# Patient Record
Sex: Male | Born: 1955
Health system: Southern US, Community
[De-identification: ages and names within clinical notes are randomized; demographics above are authoritative.]

## PROBLEM LIST (undated history)

## (undated) DIAGNOSIS — N400 Enlarged prostate without lower urinary tract symptoms: Secondary | ICD-10-CM

## (undated) HISTORY — PX: KNEE ARTHROSCOPY W/ MENISCECTOMY: SHX1879

## (undated) HISTORY — PX: CHOLECYSTECTOMY: SHX55

---

## 2019-11-09 DIAGNOSIS — Z0001 Encounter for general adult medical examination with abnormal findings: Secondary | ICD-10-CM | POA: Diagnosis not present

## 2019-11-09 DIAGNOSIS — Z125 Encounter for screening for malignant neoplasm of prostate: Secondary | ICD-10-CM | POA: Diagnosis not present

## 2019-11-24 DIAGNOSIS — R7301 Impaired fasting glucose: Secondary | ICD-10-CM | POA: Diagnosis not present

## 2019-11-24 DIAGNOSIS — E785 Hyperlipidemia, unspecified: Secondary | ICD-10-CM | POA: Diagnosis not present

## 2019-11-24 DIAGNOSIS — N401 Enlarged prostate with lower urinary tract symptoms: Secondary | ICD-10-CM | POA: Diagnosis not present

## 2020-05-31 DIAGNOSIS — E785 Hyperlipidemia, unspecified: Secondary | ICD-10-CM | POA: Diagnosis not present

## 2020-05-31 DIAGNOSIS — Z79899 Other long term (current) drug therapy: Secondary | ICD-10-CM | POA: Diagnosis not present

## 2020-05-31 DIAGNOSIS — R7301 Impaired fasting glucose: Secondary | ICD-10-CM | POA: Diagnosis not present

## 2020-06-04 DIAGNOSIS — E785 Hyperlipidemia, unspecified: Secondary | ICD-10-CM | POA: Diagnosis not present

## 2020-06-04 DIAGNOSIS — R7301 Impaired fasting glucose: Secondary | ICD-10-CM | POA: Diagnosis not present

## 2020-12-19 DIAGNOSIS — D487 Neoplasm of uncertain behavior of other specified sites: Secondary | ICD-10-CM | POA: Diagnosis not present

## 2020-12-19 DIAGNOSIS — R590 Localized enlarged lymph nodes: Secondary | ICD-10-CM | POA: Diagnosis not present

## 2020-12-21 ENCOUNTER — Other Ambulatory Visit (HOSPITAL_COMMUNITY): Payer: Self-pay | Admitting: Otolaryngology

## 2020-12-21 DIAGNOSIS — R221 Localized swelling, mass and lump, neck: Secondary | ICD-10-CM

## 2020-12-28 ENCOUNTER — Ambulatory Visit (HOSPITAL_BASED_OUTPATIENT_CLINIC_OR_DEPARTMENT_OTHER): Payer: BC Managed Care – PPO

## 2020-12-28 ENCOUNTER — Encounter (HOSPITAL_BASED_OUTPATIENT_CLINIC_OR_DEPARTMENT_OTHER): Payer: Self-pay

## 2021-01-07 ENCOUNTER — Ambulatory Visit
Admission: RE | Admit: 2021-01-07 | Discharge: 2021-01-07 | Disposition: A | Discharge status: Home / Self Care | Payer: BC Managed Care – PPO | Source: Ambulatory Visit | Attending: Otolaryngology | Admitting: Otolaryngology

## 2021-01-07 DIAGNOSIS — J387 Other diseases of larynx: Secondary | ICD-10-CM | POA: Diagnosis not present

## 2021-01-07 DIAGNOSIS — J341 Cyst and mucocele of nose and nasal sinus: Secondary | ICD-10-CM | POA: Diagnosis not present

## 2021-01-07 DIAGNOSIS — J0100 Acute maxillary sinusitis, unspecified: Secondary | ICD-10-CM | POA: Diagnosis not present

## 2021-01-07 DIAGNOSIS — R221 Localized swelling, mass and lump, neck: Secondary | ICD-10-CM

## 2021-01-07 DIAGNOSIS — J320 Chronic maxillary sinusitis: Secondary | ICD-10-CM | POA: Diagnosis not present

## 2021-01-07 MED ORDER — IOPAMIDOL (ISOVUE-300) INJECTION 61%
75.0000 mL | Freq: Once | INTRAVENOUS | Status: AC | PRN
  Administered 2021-01-07: 75 mL via INTRAVENOUS

## 2021-01-08 ENCOUNTER — Other Ambulatory Visit: Payer: Self-pay | Admitting: Otolaryngology

## 2021-01-08 DIAGNOSIS — R590 Localized enlarged lymph nodes: Secondary | ICD-10-CM | POA: Diagnosis not present

## 2021-01-08 DIAGNOSIS — D3705 Neoplasm of uncertain behavior of pharynx: Secondary | ICD-10-CM | POA: Diagnosis not present

## 2021-01-14 ENCOUNTER — Other Ambulatory Visit: Payer: Self-pay

## 2021-01-14 ENCOUNTER — Encounter (HOSPITAL_BASED_OUTPATIENT_CLINIC_OR_DEPARTMENT_OTHER): Payer: Self-pay | Admitting: Otolaryngology

## 2021-01-15 ENCOUNTER — Other Ambulatory Visit (HOSPITAL_COMMUNITY): Payer: BC Managed Care – PPO

## 2021-01-18 ENCOUNTER — Other Ambulatory Visit: Payer: Self-pay

## 2021-01-18 ENCOUNTER — Ambulatory Visit (HOSPITAL_BASED_OUTPATIENT_CLINIC_OR_DEPARTMENT_OTHER): Payer: BC Managed Care – PPO | Admitting: Anesthesiology

## 2021-01-18 ENCOUNTER — Ambulatory Visit (HOSPITAL_BASED_OUTPATIENT_CLINIC_OR_DEPARTMENT_OTHER)
Admission: RE | Admit: 2021-01-18 | Discharge: 2021-01-18 | Disposition: A | Discharge status: Home / Self Care | Payer: BC Managed Care – PPO | Attending: Otolaryngology | Admitting: Otolaryngology

## 2021-01-18 ENCOUNTER — Encounter (HOSPITAL_BASED_OUTPATIENT_CLINIC_OR_DEPARTMENT_OTHER)
Admission: RE | Disposition: A | Discharge status: Home / Self Care | Payer: Self-pay | Source: Home / Self Care | Attending: Otolaryngology

## 2021-01-18 ENCOUNTER — Encounter (HOSPITAL_BASED_OUTPATIENT_CLINIC_OR_DEPARTMENT_OTHER): Payer: Self-pay | Admitting: Otolaryngology

## 2021-01-18 DIAGNOSIS — C490 Malignant neoplasm of connective and soft tissue of head, face and neck: Secondary | ICD-10-CM | POA: Insufficient documentation

## 2021-01-18 DIAGNOSIS — R590 Localized enlarged lymph nodes: Secondary | ICD-10-CM | POA: Diagnosis not present

## 2021-01-18 DIAGNOSIS — C760 Malignant neoplasm of head, face and neck: Secondary | ICD-10-CM | POA: Diagnosis not present

## 2021-01-18 DIAGNOSIS — D3705 Neoplasm of uncertain behavior of pharynx: Secondary | ICD-10-CM | POA: Diagnosis not present

## 2021-01-18 DIAGNOSIS — N400 Enlarged prostate without lower urinary tract symptoms: Secondary | ICD-10-CM | POA: Diagnosis not present

## 2021-01-18 DIAGNOSIS — R221 Localized swelling, mass and lump, neck: Secondary | ICD-10-CM | POA: Diagnosis not present

## 2021-01-18 DIAGNOSIS — J358 Other chronic diseases of tonsils and adenoids: Secondary | ICD-10-CM | POA: Diagnosis not present

## 2021-01-18 HISTORY — PX: TONSILLECTOMY: SHX5217

## 2021-01-18 HISTORY — DX: Benign prostatic hyperplasia without lower urinary tract symptoms: N40.0

## 2021-01-18 HISTORY — PX: DIRECT LARYNGOSCOPY: SHX5326

## 2021-01-18 HISTORY — PX: MASS BIOPSY: SHX5445

## 2021-01-18 SURGERY — TONSILLECTOMY
Anesthesia: General | Site: Throat

## 2021-01-18 MED ORDER — FENTANYL CITRATE (PF) 100 MCG/2ML IJ SOLN: 25.0000 ug | INTRAMUSCULAR | Status: DC | PRN

## 2021-01-18 MED ORDER — DEXAMETHASONE SODIUM PHOSPHATE 4 MG/ML IJ SOLN
INTRAMUSCULAR | Status: DC | PRN
  Administered 2021-01-18: 10 mg via INTRAVENOUS

## 2021-01-18 MED ORDER — LIDOCAINE-EPINEPHRINE 1 %-1:100000 IJ SOLN
INTRAMUSCULAR | Status: AC
  Filled 2021-01-18: qty 2

## 2021-01-18 MED ORDER — MIDAZOLAM HCL 2 MG/2ML IJ SOLN
INTRAMUSCULAR | Status: AC
  Filled 2021-01-18: qty 2

## 2021-01-18 MED ORDER — ONDANSETRON HCL 4 MG/2ML IJ SOLN
INTRAMUSCULAR | Status: DC | PRN
  Administered 2021-01-18: 4 mg via INTRAVENOUS

## 2021-01-18 MED ORDER — FENTANYL CITRATE (PF) 100 MCG/2ML IJ SOLN
INTRAMUSCULAR | Status: AC
  Filled 2021-01-18: qty 2

## 2021-01-18 MED ORDER — LIDOCAINE 2% (20 MG/ML) 5 ML SYRINGE
INTRAMUSCULAR | Status: AC
  Filled 2021-01-18: qty 5

## 2021-01-18 MED ORDER — ONDANSETRON HCL 4 MG/2ML IJ SOLN
INTRAMUSCULAR | Status: AC
  Filled 2021-01-18: qty 2

## 2021-01-18 MED ORDER — BACITRACIN ZINC 500 UNIT/GM EX OINT
TOPICAL_OINTMENT | CUTANEOUS | Status: AC
  Filled 2021-01-18: qty 0.9

## 2021-01-18 MED ORDER — FENTANYL CITRATE (PF) 100 MCG/2ML IJ SOLN
INTRAMUSCULAR | Status: DC | PRN
  Administered 2021-01-18: 50 ug via INTRAVENOUS
  Administered 2021-01-18 (×4): 25 ug via INTRAVENOUS
  Administered 2021-01-18 (×2): 50 ug via INTRAVENOUS

## 2021-01-18 MED ORDER — PHENYLEPHRINE HCL (PRESSORS) 10 MG/ML IV SOLN: INTRAVENOUS | Status: DC | PRN

## 2021-01-18 MED ORDER — EPHEDRINE SULFATE 50 MG/ML IJ SOLN
INTRAMUSCULAR | Status: DC | PRN
  Administered 2021-01-18 (×3): 10 mg via INTRAVENOUS

## 2021-01-18 MED ORDER — OXYCODONE-ACETAMINOPHEN 5-325 MG PO TABS: 1.0000 | ORAL_TABLET | ORAL | 0 refills | Status: AC | PRN

## 2021-01-18 MED ORDER — LACTATED RINGERS IV SOLN: INTRAVENOUS | Status: DC

## 2021-01-18 MED ORDER — ROCURONIUM BROMIDE 10 MG/ML (PF) SYRINGE
PREFILLED_SYRINGE | INTRAVENOUS | Status: AC
  Filled 2021-01-18: qty 10

## 2021-01-18 MED ORDER — AMOXICILLIN 400 MG/5ML PO SUSR: 800.0000 mg | Freq: Two times a day (BID) | ORAL | 0 refills | Status: AC

## 2021-01-18 MED ORDER — LIDOCAINE 2% (20 MG/ML) 5 ML SYRINGE
INTRAMUSCULAR | Status: DC | PRN
  Administered 2021-01-18: 80 mg via INTRAVENOUS

## 2021-01-18 MED ORDER — LIDOCAINE-EPINEPHRINE 1 %-1:100000 IJ SOLN
INTRAMUSCULAR | Status: DC | PRN
  Administered 2021-01-18: 3 mL

## 2021-01-18 MED ORDER — PROPOFOL 500 MG/50ML IV EMUL
INTRAVENOUS | Status: AC
  Filled 2021-01-18: qty 50

## 2021-01-18 MED ORDER — DEXAMETHASONE SODIUM PHOSPHATE 10 MG/ML IJ SOLN
INTRAMUSCULAR | Status: AC
  Filled 2021-01-18: qty 1

## 2021-01-18 MED ORDER — PROPOFOL 10 MG/ML IV BOLUS
INTRAVENOUS | Status: DC | PRN
  Administered 2021-01-18: 150 mg via INTRAVENOUS
  Administered 2021-01-18: 50 mg via INTRAVENOUS

## 2021-01-18 MED ORDER — CEFAZOLIN SODIUM-DEXTROSE 2-3 GM-%(50ML) IV SOLR
INTRAVENOUS | Status: DC | PRN
  Administered 2021-01-18: 2 g via INTRAVENOUS

## 2021-01-18 MED ORDER — PHENYLEPHRINE HCL-NACL 10-0.9 MG/250ML-% IV SOLN
INTRAVENOUS | Status: DC | PRN
  Administered 2021-01-18: 50 ug/min via INTRAVENOUS

## 2021-01-18 MED ORDER — PROPOFOL 500 MG/50ML IV EMUL
INTRAVENOUS | Status: DC | PRN
  Administered 2021-01-18: 50 ug/kg/min via INTRAVENOUS

## 2021-01-18 MED ORDER — OXYMETAZOLINE HCL 0.05 % NA SOLN
NASAL | Status: AC
  Filled 2021-01-18: qty 60

## 2021-01-18 MED ORDER — SUCCINYLCHOLINE CHLORIDE 20 MG/ML IJ SOLN
INTRAMUSCULAR | Status: DC | PRN
  Administered 2021-01-18: 140 mg via INTRAVENOUS

## 2021-01-18 MED ORDER — MIDAZOLAM HCL 5 MG/5ML IJ SOLN
INTRAMUSCULAR | Status: DC | PRN
  Administered 2021-01-18: 2 mg via INTRAVENOUS

## 2021-01-18 MED ORDER — PHENYLEPHRINE HCL (PRESSORS) 10 MG/ML IV SOLN
INTRAVENOUS | Status: DC | PRN
  Administered 2021-01-18 (×2): 80 ug via INTRAVENOUS

## 2021-01-18 SURGICAL SUPPLY — 83 items
ADH SKN CLS APL DERMABOND .7 (GAUZE/BANDAGES/DRESSINGS) ×3
APL SKNCLS STERI-STRIP NONHPOA (GAUZE/BANDAGES/DRESSINGS)
BENZOIN TINCTURE PRP APPL 2/3 (GAUZE/BANDAGES/DRESSINGS) IMPLANT
BLADE SURG 15 STRL LF DISP TIS (BLADE) ×3 IMPLANT
BLADE SURG 15 STRL SS (BLADE) ×4
BNDG COHESIVE 2X5 TAN STRL LF (GAUZE/BANDAGES/DRESSINGS) IMPLANT
CANISTER SUCT 1200ML W/VALVE (MISCELLANEOUS) ×4 IMPLANT
CATH ROBINSON RED A/P 10FR (CATHETERS) IMPLANT
CATH ROBINSON RED A/P 14FR (CATHETERS) ×4 IMPLANT
CLEANER CAUTERY TIP 5X5 PAD (MISCELLANEOUS) IMPLANT
CLIP VESOCCLUDE MED 6/CT (CLIP) IMPLANT
CLIP VESOCCLUDE SM WIDE 6/CT (CLIP) IMPLANT
COAGULATOR SUCT SWTCH 10FR 6 (ELECTROSURGICAL) ×4 IMPLANT
CORD BIPOLAR FORCEPS 12FT (ELECTRODE) ×4 IMPLANT
COVER BACK TABLE 60X90IN (DRAPES) ×8 IMPLANT
COVER MAYO STAND STRL (DRAPES) ×8 IMPLANT
COVER WAND RF STERILE (DRAPES) IMPLANT
DECANTER SPIKE VIAL GLASS SM (MISCELLANEOUS) ×4 IMPLANT
DERMABOND ADVANCED (GAUZE/BANDAGES/DRESSINGS) ×1
DERMABOND ADVANCED .7 DNX12 (GAUZE/BANDAGES/DRESSINGS) ×3 IMPLANT
DRAIN JACKSON RD 7FR 3/32 (WOUND CARE) IMPLANT
DRAIN PENROSE 12X.25 LTX STRL (MISCELLANEOUS) IMPLANT
DRAPE U-SHAPE 76X120 STRL (DRAPES) ×8 IMPLANT
ELECT COATED BLADE 2.86 ST (ELECTRODE) ×4 IMPLANT
ELECT NEEDLE BLADE 2-5/6 (NEEDLE) IMPLANT
ELECT PAIRED SUBDERMAL (MISCELLANEOUS) ×4
ELECT REM PT RETURN 9FT ADLT (ELECTROSURGICAL) ×4
ELECT REM PT RETURN 9FT PED (ELECTROSURGICAL)
ELECTRODE PAIRED SUBDERMAL (MISCELLANEOUS) ×3 IMPLANT
ELECTRODE REM PT RETRN 9FT PED (ELECTROSURGICAL) IMPLANT
ELECTRODE REM PT RTRN 9FT ADLT (ELECTROSURGICAL) ×3 IMPLANT
EVACUATOR SILICONE 100CC (DRAIN) IMPLANT
FORCEPS BIPOLAR SPETZLER 8 1.0 (NEUROSURGERY SUPPLIES) ×4 IMPLANT
GAUZE 4X4 16PLY RFD (DISPOSABLE) ×4 IMPLANT
GAUZE SPONGE 4X4 12PLY STRL LF (GAUZE/BANDAGES/DRESSINGS) ×4 IMPLANT
GLOVE SURG ENC MOIS LTX SZ7.5 (GLOVE) ×8 IMPLANT
GOWN STRL REUS W/ TWL LRG LVL3 (GOWN DISPOSABLE) ×12 IMPLANT
GOWN STRL REUS W/TWL LRG LVL3 (GOWN DISPOSABLE) ×16
HEMOSTAT SNOW SURGICEL 2X4 (HEMOSTASIS) ×4 IMPLANT
HEMOSTAT SURGICEL .5X2 ABSORB (HEMOSTASIS) IMPLANT
IV NS 500ML (IV SOLUTION) ×4
IV NS 500ML BAXH (IV SOLUTION) ×3 IMPLANT
LOCATOR NERVE 3 VOLT (DISPOSABLE) IMPLANT
MARKER SKIN DUAL TIP RULER LAB (MISCELLANEOUS) ×4 IMPLANT
NEEDLE HYPO 25X1 1.5 SAFETY (NEEDLE) ×4 IMPLANT
NEEDLE PRECISIONGLIDE 27X1.5 (NEEDLE) IMPLANT
NS IRRIG 1000ML POUR BTL (IV SOLUTION) ×4 IMPLANT
PACK BASIN DAY SURGERY FS (CUSTOM PROCEDURE TRAY) ×4 IMPLANT
PAD CLEANER CAUTERY TIP 5X5 (MISCELLANEOUS)
PENCIL SMOKE EVACUATOR (MISCELLANEOUS) ×4 IMPLANT
PIN SAFETY STERILE (MISCELLANEOUS) IMPLANT
PROBE NERVBE PRASS .33 (MISCELLANEOUS) ×4 IMPLANT
SHEARS HARMONIC 9CM CVD (BLADE) ×4 IMPLANT
SHEET MEDIUM DRAPE 40X70 STRL (DRAPES) ×4 IMPLANT
SLEEVE SCD COMPRESS KNEE MED (STOCKING) ×4 IMPLANT
SOLUTION BUTLER CLEAR DIP (MISCELLANEOUS) ×4 IMPLANT
SPONGE GAUZE 2X2 8PLY STRL LF (GAUZE/BANDAGES/DRESSINGS) IMPLANT
SPONGE INTESTINAL PEANUT (DISPOSABLE) ×4 IMPLANT
SPONGE TONSIL TAPE 1.25 RFD (DISPOSABLE) IMPLANT
STAPLER VISISTAT 35W (STAPLE) ×4 IMPLANT
STRIP CLOSURE SKIN 1/4X4 (GAUZE/BANDAGES/DRESSINGS) IMPLANT
SUCTION FRAZIER HANDLE 10FR (MISCELLANEOUS)
SUCTION TUBE FRAZIER 10FR DISP (MISCELLANEOUS) IMPLANT
SUT ETHILON 3 0 PS 1 (SUTURE) IMPLANT
SUT ETHILON 4 0 PS 2 18 (SUTURE) IMPLANT
SUT PROLENE 5 0 P 3 (SUTURE) IMPLANT
SUT SILK 3 0 TIES 17X18 (SUTURE) ×4
SUT SILK 3-0 18XBRD TIE BLK (SUTURE) ×3 IMPLANT
SUT SILK 4 0 TIES 17X18 (SUTURE) IMPLANT
SUT VIC AB 3-0 FS2 27 (SUTURE) IMPLANT
SUT VIC AB 4-0 P-3 18XBRD (SUTURE) IMPLANT
SUT VIC AB 4-0 P3 18 (SUTURE)
SUT VIC AB 4-0 RB1 27 (SUTURE)
SUT VIC AB 4-0 RB1 27X BRD (SUTURE) IMPLANT
SUT VICRYL 4-0 PS2 18IN ABS (SUTURE) ×4 IMPLANT
SYR BULB EAR ULCER 3OZ GRN STR (SYRINGE) ×8 IMPLANT
SYR CONTROL 10ML LL (SYRINGE) ×4 IMPLANT
TOWEL GREEN STERILE FF (TOWEL DISPOSABLE) ×8 IMPLANT
TRAY DSU PREP LF (CUSTOM PROCEDURE TRAY) ×4 IMPLANT
TUBE CONNECTING 20X1/4 (TUBING) ×8 IMPLANT
TUBE SALEM SUMP 12R W/ARV (TUBING) IMPLANT
TUBE SALEM SUMP 16 FR W/ARV (TUBING) ×4 IMPLANT
WAND COBLATOR 70 EVAC XTRA (SURGICAL WAND) ×4 IMPLANT

## 2021-01-18 NOTE — Op Note (Signed)
DATE OF PROCEDURE:  01/18/2021                              OPERATIVE REPORT  SURGEON:  Leta Baptist, MD  PREOPERATIVE DIAGNOSES: 1. Left neck mass 2. Possible left tonsillar mass  POSTOPERATIVE DIAGNOSES: 1. Left neck mass 2. Possible left tonsillar mass  PROCEDURE PERFORMED:   1. Left level 2 deep neck mass excision 2. Left tonsillectomy 3. Direct laryngoscopy  ANESTHESIA:  General endotracheal tube anesthesia.  COMPLICATIONS:  None.  ESTIMATED BLOOD LOSS:  Minimal.  INDICATION FOR PROCEDURE:  Taylor Turner. Disney is a 65 y.o. male with a history of an enlarging 3 cm left neck mass.  The patient subsequently underwent a neck CT scan.  The CT showed a 3.2 cm heterogeneously enhancing mass within the left neck at Level II station.  The radiographic findings were suggestive of metastatic disease.  No other cervical lymphadenopathy was noted.  No definitive primary mass was noted within the oral cavity, pharynx, or larynx. However, a firm mass was palpable within the left tonsillar fossa.  Based on the above findings, the decision was made for the patient to undergo the left tonsillectomy procedure.  Intraoperatively, no obvious mass was noted within the left tonsil.  The previously palpable firm mass was secondary to a calcified stylohyoid ligament.  The decision was therefore to proceed with direct laryngoscopy and excision of the left neck mass.  It should be noted that the patient previously stated his preference for left neck mass excision rather than fine-needle aspiration or core needle biopsy.    DESCRIPTION:  The patient was taken to the operating room and placed supine on the operating table.  General endotracheal tube anesthesia was administered by the anesthesiologist.  The patient was positioned and prepped and draped in a standard fashion for tonsillectomy.  A Crowe-Davis mouth gag was inserted into the oral cavity for exposure. 1+ tonsils were noted bilaterally.  No bifidity was noted.   Indirect mirror examination of the nasopharynx revealed no visible lesion.  The left tonsil was then grasped with a straight Allis clamp and retracted medially.  It was resected free from the underlying pharyngeal constrictor muscles with the Coblator device.  No palpable mass was noted within the tonsil.  The tonsil was sent to the pathology department for frozen section analysis.  The previously noted firm mass within the tonsillar fossa was found to be the calcified stylohyoid ligament.   The decision was then made to perform the direct laryngoscopy procedure.  The mouthgag was removed.  Dedo laryngoscope was used for examination.  The Dedo laryngoscope was inserted via the oral cavity into the pharynx.  Examination of the vallecula, epiglottis, aryepiglottic folds, piriform sinus, and the vocal cords were all normal.  The Dedo laryngoscope was withdrawn.  The patient was repositioned and prepped and draped in a standard fashion for left neck mass excision.  The facial nerve monitoring electrodes were placed.  1% lidocaine with 1-100,000 epinephrine was infiltrated at the planned site of incision.  The patient was noted to have a 3 cm left level 2 neck mass.  A transverse incision was made over the left neck mass.  The incision was carried down to the level of the platysma muscles.  Superiorly and inferiorly based subplatysmal flaps were elevated in the standard fashion.  The sternocleidomastoid muscles was then retracted laterally, exposing the 3 cm level-2 mass.  Careful dissection was  then carried out to free the soft tissue mass from the surrounding structures.  The spinal accessory nerve was identified and preserved.  The internal jugular vein was also identified and carefully dissected free from the mass.  The entire mass was removed and sent to the pathology department for identification.  The surgical site was copiously irrigated.  The incision was closed in layers with 4-0 Vicryl and  Dermabond.  The care of the patient was turned over to the anesthesiologist.  The patient was awakened from anesthesia without difficulty.  The patient was extubated and transferred to the recovery room in good condition.  OPERATIVE FINDINGS: Normal left tonsil.  No suspicious lesion was noted on today's direct laryngoscopy examination. A 3 cm left level 2 neck mass was noted.  SPECIMEN: Left neck mass.  FOLLOWUP CARE:  The patient will be discharged home once awake and alert. The patient will follow up in my office in approximately 1 week.  Chetan Mehring W Treavon Castilleja 01/18/2021 9:45 AM

## 2021-01-18 NOTE — Discharge Instructions (Signed)
Post Anesthesia Home Care Instructions  Activity: Get plenty of rest for the remainder of the day. A responsible individual must stay with you for 24 hours following the procedure.  For the next 24 hours, DO NOT: -Drive a car -Paediatric nurse -Drink alcoholic beverages -Take any medication unless instructed by your physician -Make any legal decisions or sign important papers.  Meals: Start with liquid foods such as gelatin or soup. Progress to regular foods as tolerated. Avoid greasy, spicy, heavy foods. If nausea and/or vomiting occur, drink only clear liquids until the nausea and/or vomiting subsides. Call your physician if vomiting continues.  Special Instructions/Symptoms: Your throat may feel dry or sore from the anesthesia or the breathing tube placed in your throat during surgery. If this causes discomfort, gargle with warm salt water. The discomfort should disappear within 24 hours.  If you had a scopolamine patch placed behind your ear for the management of post- operative nausea and/or vomiting:  1. The medication in the patch is effective for 72 hours, after which it should be removed.  Wrap patch in a tissue and discard in the trash. Wash hands thoroughly with soap and water. 2. You may remove the patch earlier than 72 hours if you experience unpleasant side effects which may include dry mouth, dizziness or visual disturbances. 3. Avoid touching the patch. Wash your hands with soap and water after contact with the patch.    -------------  Emery Dupuy [facility] M[facility] Postoperative Instructions for Tonsillectomy  Activity Restrict activity at home for the first two days, resting as much as possible. Light indoor activity is best. You may usually return to school or work within a week but void strenuous activity and sports for two weeks. Sleep with your head elevated on 2-3 pillows for 3-4 days to help decrease swelling. Diet Due to tissue swelling and throat discomfort, you  may have little desire to drink for several days. However fluids are very important to prevent dehydration. You will find that non-acidic juices, soups, popsicles, Jell-O, custard, puddings, and any soft or mashed foods taken in small quantities can be swallowed fairly easily. Try to increase your fluid and food intake as the discomfort subsides. It is recommended that a child receive 1-1/2 quarts of fluid in a 24-hour period. Adult require twice this amount.  Discomfort Your sore throat may be relieved by applying an ice collar to your neck and/or by taking Tylenol. You may experience an earache, which is due to referred pain from the throat. Referred ear pain is commonly felt at night when trying to rest.  Bleeding                        Although rare, there is risk of having some bleeding during the first 2 weeks after having a T&A. This usually happens between days 7-10 postoperatively. If you or your child should have any bleeding, try to remain calm. We recommend sitting up quietly in a chair and gently spitting out the blood into a bowl. For adults, gargling gently with ice water may help. If the bleeding does not stop after a short time (5 minutes), is more than 1 teaspoonful, or if you become worried, please call our office at [phone] or go directly to the nearest hospital emergency room. Do not eat or drink anything prior to going to the hospital as you may need to be taken to the operating room in order to control the bleeding. GENERAL  CONSIDERATIONS 1. Brush your teeth regularly. Avoid mouthwashes and gargles for three weeks. You may gargle gently with warm salt-water as necessary or spray with Chloraseptic. You may make salt-water by placing 2 teaspoons of table salt into a quart of fresh water. Warm the salt-water in a microwave to a luke warm temperature.  2. Avoid exposure to colds and upper respiratory infections if possible.  3. If you look into a mirror or into your child's mouth,  you will see white-gray patches in the back of the throat. This is normal after having a T&A and is like a scab that forms on the skin after an abrasion. It will disappear once the back of the throat heals completely. However, it may cause a noticeable odor; this too will disappear with time. Again, warm salt-water gargles may be used to help keep the throat clean and promote healing.  4. You may notice a temporary change in voice quality, such as a higher pitched voice or a nasal sound, until healing is complete. This may last for 1-2 weeks and should resolve.  5. Do not take or give you child any medications that we have not prescribed or recommended.  6. Snoring may occur, especially at night, for the first week after a T&A. It is due to swelling of the soft palate and will usually resolve.  Please call our office at [phone] if you have any questions.

## 2021-01-18 NOTE — Transfer of Care (Signed)
Immediate Anesthesia Transfer of Care Note  Patient: Taylor Turner  Procedure(s) Performed: TONSILLECTOMY WITH FROZEN SECTION (Left Throat) LEFT NECK MASS EXCISION (Left Neck) DIRECT LARYNGOSCOPY (N/A Throat)  Patient Location: PACU  Anesthesia Type:General  Level of Consciousness: sedated  Airway & Oxygen Therapy: Patient Spontanous Breathing and Patient connected to face mask oxygen  Post-op Assessment: Report given to RN and Post -op Vital signs reviewed and stable  Post vital signs: Reviewed and stable  Last Vitals:  Vitals Value Taken Time  BP 128/75 01/18/21 0948  Temp 36.5 C 01/18/21 0948  Pulse 85 01/18/21 0950  Resp 14 01/18/21 0950  SpO2 97 % 01/18/21 0950  Vitals shown include unvalidated device data.  Last Pain:  Vitals:   01/18/21 0648  TempSrc: Oral  PainSc: 0-No pain         Complications: No complications documented.

## 2021-01-18 NOTE — Anesthesia Postprocedure Evaluation (Signed)
Anesthesia Post Note  Patient: Taylor Turner. Junkins  Procedure(s) Performed: TONSILLECTOMY WITH FROZEN SECTION (Left Throat) LEFT NECK MASS EXCISION (Left Neck) DIRECT LARYNGOSCOPY (N/A Throat)     Patient location during evaluation: PACU Anesthesia Type: General Level of consciousness: awake Pain management: pain level controlled Vital Signs Assessment: post-procedure vital signs reviewed and stable Respiratory status: spontaneous breathing Cardiovascular status: stable Postop Assessment: no apparent nausea or vomiting Anesthetic complications: no   No complications documented.  Last Vitals:  Vitals:   01/18/21 1000 01/18/21 1025  BP: 121/81 126/72  Pulse: 94 92  Resp: 14 16  Temp:  36.6 C  SpO2: 96% 96%    Last Pain:  Vitals:   01/18/21 1025  TempSrc:   PainSc: 0-No pain                 Lerry Cordrey

## 2021-01-18 NOTE — Anesthesia Procedure Notes (Signed)
Procedure Name: Intubation Date/Time: 01/18/2021 7:35 AM Performed by: Maryella Shivers, CRNA Pre-anesthesia Checklist: Patient identified, Emergency Drugs available, Suction available and Patient being monitored Patient Re-evaluated:Patient Re-evaluated prior to induction Oxygen Delivery Method: Circle system utilized Preoxygenation: Pre-oxygenation with 100% oxygen Induction Type: IV induction Ventilation: Mask ventilation without difficulty Laryngoscope Size: Mac and 4 Grade View: Grade I Tube type: Oral Tube size: 7.5 mm Number of attempts: 1 Airway Equipment and Method: Stylet and Oral airway Placement Confirmation: ETT inserted through vocal cords under direct vision,  positive ETCO2 and breath sounds checked- equal and bilateral Secured at: 22 cm Tube secured with: Tape Dental Injury: Teeth and Oropharynx as per pre-operative assessment

## 2021-01-18 NOTE — Anesthesia Preprocedure Evaluation (Addendum)
Anesthesia Evaluation  Patient identified by MRN, date of birth, ID band Patient awake    Reviewed: Allergy & Precautions, NPO status , Patient's Chart, lab work & pertinent test results  Airway Mallampati: II  TM Distance: >3 FB     Dental   Pulmonary former smoker,    breath sounds clear to auscultation       Cardiovascular negative cardio ROS   Rhythm:Regular Rate:Normal     Neuro/Psych    GI/Hepatic negative GI ROS, Neg liver ROS,   Endo/Other  negative endocrine ROS  Renal/GU negative Renal ROS     Musculoskeletal   Abdominal   Peds  Hematology   Anesthesia Other Findings   Reproductive/Obstetrics                             Anesthesia Physical Anesthesia Plan  ASA: II  Anesthesia Plan: General   Post-op Pain Management:    Induction: Intravenous  PONV Risk Score and Plan: 3 and Ondansetron, Dexamethasone and Midazolam  Airway Management Planned: Oral ETT  Additional Equipment:   Intra-op Plan:   Post-operative Plan: Extubation in OR  Informed Consent: I have reviewed the patients History and Physical, chart, labs and discussed the procedure including the risks, benefits and alternatives for the proposed anesthesia with the patient or authorized representative who has indicated his/her understanding and acceptance.     Dental advisory given  Plan Discussed with: CRNA and Anesthesiologist  Anesthesia Plan Comments:        Anesthesia Quick Evaluation

## 2021-01-18 NOTE — H&P (Signed)
CC: Left neck mass, left tonsillar mass  HPI: The patient is a 65 year old male who returns today for his follow-up evaluation. The patient was last seen 3 weeks ago.  He was noted to have a 3 cm left neck mass.  No obvious mass or lesion was noted on his laryngoscopy examination.  The patient subsequently underwent a neck CT scan.  The CT showed a 3.2 cm heterogeneously enhancing mass within the left neck at Level II station.  The radiographic findings were suggestive of metastatic disease.  No other cervical lymphadenopathy was noted.  No definitive primary mass was noted within the oral cavity, pharynx, or larynx.  The patient returns reporting no significant change in his left neck mass.  He denies any significant dysphagia, odynophagia or dyspnea.   Exam: General: Communicates without difficulty, well nourished, no acute distress. Head: Normocephalic, no evidence injury, no tenderness, facial buttresses intact without stepoff. Face/sinus: No tenderness to palpation and percussion. Facial movement is normal and symmetric. Eyes: PERRL, EOMI. No scleral icterus, conjunctivae clear. Neuro: CN II exam reveals vision grossly intact.  No nystagmus at any point of gaze. Ears: Auricles well formed without lesions.  Ear canals are intact without mass or lesion.  No erythema or edema is appreciated.  The TMs are intact without fluid. Nose: External evaluation reveals normal support and skin without lesions.  Dorsum is intact.  Anterior rhinoscopy reveals pink mucosa over anterior aspect of inferior turbinates and intact septum.  No purulence noted. Oral:  Oral cavity and oropharynx are intact, symmetric, without erythema or edema.  Mucosa is moist without lesions. 1+ tonsils bilaterally.  No significant asymmetry is noted. However, a firm mass is palpable within the left tonsillar fossa. Neck: Full range of motion without pain. The patient is noted to have a 3.2 cm firm mass within the left Level II neck.  Thyroid  bed within normal limits to palpation.  Parotid glands and submandibular glands equal bilaterally without mass.  Trachea is midline. Neuro:  CN 2-12 grossly intact. Gait normal.   Assessment: 1.  The patient has a 3.2 cm enhancing soft tissue mass within the left Level II neck.   2.  No visible lesions are noted on today's ENT examination.  However, a firm mass is palpable within the left tonsillar fossa.  This most likely represents the primary lesion.    Plan: 1.  The physical exam findings and the CT results are reviewed with the patient.   2.  Based on the above findings, the patient will benefit from undergoing left tonsillectomy procedure with intraoperative frozen section analysis.  If the left tonsil is negative, we will proceed with left neck mass biopsy.  The risks, benefits, alternatives and details of the procedures are reviewed with the patient.  Questions are invited and answered.  3.  The patient would like to proceed with the procedures.

## 2021-01-21 ENCOUNTER — Encounter (HOSPITAL_BASED_OUTPATIENT_CLINIC_OR_DEPARTMENT_OTHER): Payer: Self-pay | Admitting: Otolaryngology

## 2021-01-25 ENCOUNTER — Other Ambulatory Visit (HOSPITAL_COMMUNITY): Payer: Self-pay | Admitting: Otolaryngology

## 2021-01-25 ENCOUNTER — Other Ambulatory Visit: Payer: Self-pay | Admitting: Otolaryngology

## 2021-01-25 DIAGNOSIS — C760 Malignant neoplasm of head, face and neck: Secondary | ICD-10-CM

## 2021-02-05 ENCOUNTER — Other Ambulatory Visit: Payer: Self-pay

## 2021-02-05 ENCOUNTER — Ambulatory Visit (HOSPITAL_COMMUNITY)
Admission: RE | Admit: 2021-02-05 | Discharge: 2021-02-05 | Disposition: A | Discharge status: Home / Self Care | Payer: BC Managed Care – PPO | Source: Ambulatory Visit | Attending: Otolaryngology | Admitting: Otolaryngology

## 2021-02-05 DIAGNOSIS — C760 Malignant neoplasm of head, face and neck: Secondary | ICD-10-CM | POA: Insufficient documentation

## 2021-02-05 LAB — GLUCOSE, CAPILLARY: Glucose-Capillary: 101 mg/dL — ABNORMAL HIGH (ref 70–99)

## 2021-02-05 MED ORDER — FLUDEOXYGLUCOSE F - 18 (FDG) INJECTION
10.7000 | Freq: Once | INTRAVENOUS | Status: AC | PRN
  Administered 2021-02-05: 10.7 via INTRAVENOUS

## 2021-02-07 ENCOUNTER — Ambulatory Visit (HOSPITAL_COMMUNITY): Payer: BC Managed Care – PPO | Admitting: Hematology

## 2021-02-13 NOTE — Progress Notes (Signed)
Taylor Turner, [place] [id number]   CLINIC:  Medical Oncology/Hematology  CONSULT NOTE  Patient Care Team: Asencion Noble, MD as PCP - General (Internal Medicine)  CHIEF COMPLAINTS/PURPOSE OF CONSULTATION:  Evaluation of squamous cell carcinoma of the neck  HISTORY OF PRESENTING ILLNESS:  Mr. Taylor Turner. [name] 65 y.o. male is here because of squamous cell carcinoma of the neck, at the request of Dr. Benjamine Mola.  Today he reports feeling good. He noticed swelling of a left lymph node in the neck 4 months ago. He never had any previous biopsies. He has not had any changes to his voice, and denies any current unusual pains or trouble swallowing. He had a PET scan 06/07 and CT Neck 05/09.   He is a retired Dealer; has been retired for 4 years. His PGF had colon cancer. He smoked <1/2 ppd for 19 years.   MEDICAL HISTORY:  Past Medical History:  Diagnosis Date   BPH (benign prostatic hyperplasia)     SURGICAL HISTORY: Past Surgical History:  Procedure Laterality Date   CHOLECYSTECTOMY     DIRECT LARYNGOSCOPY N/A 01/18/2021   Procedure: DIRECT LARYNGOSCOPY;  Surgeon: Leta Baptist, MD;  Location: [facility];  Service: ENT;  Laterality: N/A;   KNEE ARTHROSCOPY W/ MENISCECTOMY Left    KNEE ARTHROSCOPY W/ MENISCECTOMY Right    MASS BIOPSY Left 01/18/2021   Procedure: LEFT NECK MASS EXCISION;  Surgeon: Leta Baptist, MD;  Location: [facility];  Service: ENT;  Laterality: Left;   TONSILLECTOMY Left 01/18/2021   Procedure: TONSILLECTOMY WITH FROZEN SECTION;  Surgeon: Leta Baptist, MD;  Location: [facility];  Service: ENT;  Laterality: Left;    SOCIAL HISTORY: Social History   Socioeconomic History   Marital status: Single    Spouse name: Not on file   Number of children: Not on file   Years of education: Not on file   Highest education level: Not on file  Occupational History   Not on file  Tobacco Use   Smoking status:  Former    Pack years: 0.00   Smokeless tobacco: Never   Tobacco comments:    occ. cigar  Substance and Sexual Activity   Alcohol use: Yes    Alcohol/week: 10.0 standard drinks    Types: 10 Standard drinks or equivalent per week   Drug use: Never   Sexual activity: Not on file  Other Topics Concern   Not on file  Social History Narrative   Not on file   Social Determinants of Health   Financial Resource Strain: Low Risk    Difficulty of Paying Living Expenses: Not hard at all  Food Insecurity: No Food Insecurity   Worried About Charity fundraiser in the Last Year: Never true   [place] in the Last Year: Never true  Transportation Needs: No Transportation Needs   Lack of Transportation (Medical): No   Lack of Transportation (Non-Medical): No  Physical Activity: Insufficiently Active   Days of Exercise per Week: 2 days   Minutes of Exercise per Session: 60 min  Stress: No Stress Concern Present   Feeling of Stress : Not at all  Social Connections: Socially Integrated   Frequency of Communication with Friends and Family: More than three times a week   Frequency of Social Gatherings with Friends and Family: More than three times a week   Attends Religious Services: 1 to 4 times per year  Active Member of Clubs or Organizations: Yes   Attends Music therapist: More than 4 times per year   Marital Status: Married  Human resources officer Violence: Not At Risk   Fear of Current or Ex-Partner: No   Emotionally Abused: No   Physically Abused: No   Sexually Abused: No    FAMILY HISTORY: Family History  Problem Relation Age of Onset   Alzheimer's disease Father    Coronary artery disease Sister    Cancer - Colon Paternal Grandfather     ALLERGIES:  has No Known Allergies.  MEDICATIONS:  Current Outpatient Medications  Medication Sig Dispense Refill   finasteride (PROSCAR) 5 MG tablet Take 5 mg by mouth daily.     silodosin (RAPAFLO) 8 MG CAPS capsule  Take 8 mg by mouth daily with breakfast.     No current facility-administered medications for this visit.    REVIEW OF SYSTEMS:   Review of Systems  Constitutional:  Negative for appetite change and fatigue.  HENT:   Negative for trouble swallowing.   All other systems reviewed and are negative.   PHYSICAL EXAMINATION: ECOG PERFORMANCE STATUS: 0 - Asymptomatic  Vitals:   02/14/21 0820  BP: 122/69  Pulse: 63  Resp: 18  Temp: 97.9 F (36.6 C)  SpO2: 99%   Filed Weights   02/14/21 0820  Weight: 226 lb 4.8 oz (102.6 kg)   Physical Exam Vitals reviewed.  Constitutional:      Appearance: Normal appearance.  Cardiovascular:     Rate and Rhythm: Normal rate and regular rhythm.     Pulses: Normal pulses.     Heart sounds: Normal heart sounds.  Pulmonary:     Effort: Pulmonary effort is normal.     Breath sounds: Normal breath sounds.  Abdominal:     Palpations: Abdomen is soft. There is no hepatomegaly, splenomegaly or mass.     Tenderness: There is no abdominal tenderness.  Musculoskeletal:     Right lower leg: No edema.     Left lower leg: No edema.  Lymphadenopathy:     Cervical: No cervical adenopathy.     Right cervical: No superficial cervical adenopathy.    Left cervical: No superficial cervical adenopathy.  Neurological:     General: No focal deficit present.     Mental Status: He is alert and oriented to person, place, and time.  Psychiatric:        Mood and Affect: Mood normal.        Behavior: Behavior normal.     LABORATORY DATA:  I have reviewed the data as listed No flowsheet data found. No flowsheet data found.  RADIOGRAPHIC STUDIES: I have personally reviewed the radiological images as listed and agreed with the findings in the report. NM PET Image Initial (PI) Skull Base To Thigh  Result Date: 02/05/2021 CLINICAL DATA:  Subsequent treatment strategy for head neck cancer. Status post tonsillectomy and excision of left neck mass on 01/18/2021.  EXAM: NUCLEAR MEDICINE PET SKULL BASE TO THIGH TECHNIQUE: 10.9 mCi F-18 FDG was injected intravenously. Full-ring PET imaging was performed from the skull base to thigh after the radiotracer. CT data was obtained and used for attenuation correction and anatomic localization. Fasting blood glucose: 101 mg/dl COMPARISON:  Neck CT 01/07/2021 FINDINGS: Mediastinal blood pool activity: SUV max 2.8 Liver activity: SUV max NA NECK: FDG accumulation along the superior surface of the left sternocleidomastoid muscle is likely related to the recent tertiary. There is a tiny gas locule just  posterior to the left sub mandibular gland, also likely related to surgery although residual soft tissue gas is only expected out to about 14 days, so this is more persistent than typically seen. There is some low level FDG accumulation in this region which is location of the excised mass seen previously. Incidental CT findings: none CHEST: No hypermetabolic mediastinal or hilar nodes. No suspicious pulmonary nodules on the CT scan. Small focus of uptake in the distal esophagus may be physiologic or related to esophagitis. Incidental CT findings: Atherosclerotic calcification is noted in the wall of the thoracic aorta. Centrilobular emphsyema noted. 6 mm right lung nodule identified on image 87/series 4. No overtly suspicious pulmonary nodule or mass. No focal airspace consolidation. There is no evidence of pleural effusion. ABDOMEN/PELVIS: No abnormal hypermetabolic activity within the liver, pancreas, adrenal glands, or spleen. No hypermetabolic lymph nodes in the abdomen or pelvis. Incidental CT findings: Diffuse low attenuation of liver parenchyma is compatible with steatosis. Gallbladder is surgically absent. Tiny nonobstructing stone noted upper pole left kidney. Left colonic diverticulosis without diverticulitis. Prostate gland is enlarged. SKELETON: No focal hypermetabolic activity to suggest skeletal metastasis. No worrisome lytic  or sclerotic osseous abnormality. Incidental CT findings: none IMPRESSION: 1. Low level uptake identified in the left neck surgical bed, likely related to healing/granulation. There is a tiny gas bubble in the surgical bed, likely related to the prior surgery although gas is unexpected after about postoperative day 14. As infection could present with soft tissue gas, close follow-up suggested. 2. No evidence for hypermetabolic metastatic disease in the neck, chest, abdomen, or pelvis. 3. 6 mm right pulmonary nodule.  Attention on follow-up recommended. 4. Hepatic steatosis. 5.  Emphysema. (ICD10-J43.9) 6.  Aortic Atherosclerois (ICD10-170.0) Electronically Signed   By: Misty Stanley M.D.   On: 02/05/2021 10:59    ASSESSMENT:  1.  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 - Evaluated by Dr. Benjamine Mola on fiberoptic examination and examination with rigid scope were negative. - CT soft tissue neck on 01/07/2021 shows 3.1 x 3.2 cm heterogeneously enhancing mass within the left neck at level 2 station.  No other cervical adenopathy. - Dr. Benjamine Mola felt abnormality in the left tonsil. - He underwent left tonsillectomy and left neck lymph node excision on 01/18/2021. - Pathology consistent with squamous cell carcinoma moderately to poorly differentiated in the left neck lymph node.  This was positive for p16 and p40.  Left tonsil was benign. - PET scan on 02/05/2021 shows low-level uptake identified in the left neck surgical bed with a tiny gas bubble in the bed.  No evidence for hypermetabolic metastatic disease in the neck, chest, abdomen or pelvis.  6 mm right pulmonary nodule.  2.  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 - Ex-smoker, smoked half pack per day of cigarettes for 19 years. - Paternal grandfather had colon cancer.   PLAN:  1.  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 Benjamine Mola and confirmed fiberoptic exam and a rigid scope exam which was negative for any suspicious lesions. - I have also talked to Dr. Melina Copa.  There is no extracapsular extension and the lymph node is surrounded by thick capsule. - We will make a referral to Dr. Isidore Moos for radiation consultation. - Pathology shows 1  adverse feature with size of the lymph node more than 3 cm.  However the definition of adverse feature is not clear in the context of HPV positive disease.  We will present his case at the tumor board.   All questions were answered. The patient knows to call the clinic with any problems, questions or concerns.   Derek Jack, MD, 02/14/21 6:14 PM  [facility] [phone]   I, Thana Ates, am acting as a scribe for Dr. Derek Jack.  I, Derek Jack MD, have reviewed the above documentation for accuracy and completeness, and I agree with the above.

## 2021-02-14 ENCOUNTER — Inpatient Hospital Stay (HOSPITAL_COMMUNITY): Payer: BC Managed Care – PPO | Attending: Hematology | Admitting: Hematology

## 2021-02-14 ENCOUNTER — Encounter (HOSPITAL_COMMUNITY): Payer: Self-pay | Admitting: Hematology

## 2021-02-14 ENCOUNTER — Other Ambulatory Visit: Payer: Self-pay

## 2021-02-14 VITALS — BP 122/69 | HR 63 | Temp 97.9°F | Resp 18 | Ht 75.5 in | Wt 226.3 lb

## 2021-02-14 DIAGNOSIS — R911 Solitary pulmonary nodule: Secondary | ICD-10-CM | POA: Insufficient documentation

## 2021-02-14 DIAGNOSIS — C760 Malignant neoplasm of head, face and neck: Secondary | ICD-10-CM | POA: Diagnosis not present

## 2021-02-14 DIAGNOSIS — Z800 Family history of malignant neoplasm of digestive organs: Secondary | ICD-10-CM | POA: Diagnosis not present

## 2021-02-14 DIAGNOSIS — C109 Malignant neoplasm of oropharynx, unspecified: Secondary | ICD-10-CM | POA: Insufficient documentation

## 2021-02-14 DIAGNOSIS — Z87891 Personal history of nicotine dependence: Secondary | ICD-10-CM | POA: Insufficient documentation

## 2021-02-14 DIAGNOSIS — C4442 Squamous cell carcinoma of skin of scalp and neck: Secondary | ICD-10-CM

## 2021-02-14 NOTE — Progress Notes (Signed)
Radiation Oncology         [phone] ________________________________  Initial Outpatient Consultation  Name: Diontay Rosencrans. Rooke MRN: [id number]  Date: 02/15/2021  DOB: 15-Jan-1956  MC:NOBSJ, Carloyn Manner, MD  Derek Jack, MD   REFERRING PHYSICIAN: Derek Jack, MD  DIAGNOSIS:  C77.0   ICD-10-CM   1. Squamous cell carcinoma of oropharynx (HCC)  C10.9     2. Secondary malignant neoplasm of cervical lymph node (HCC)  C77.0      Patient presents with squamous cell carcinoma of cervical lymph node, unknown primary  Cancer Staging Secondary malignant neoplasm of cervical lymph node ([place]) Staging form: Pharynx - HPV-Mediated Oropharynx, AJCC 8th Edition - Clinical stage from 02/15/2021: Stage I (cT0, cN1, cM0, p16+) - Signed by Eppie Gibson, MD on 02/15/2021 Stage prefix: Initial diagnosis  Squamous cell carcinoma of oropharynx ([place]) Staging form: Cervical Lymph Nodes and Unknown Primary Tumors of the Head and Neck, AJCC 8th Edition - Clinical stage from 02/14/2021: cN2a, cM0 - Unsigned Stage prefix: Initial diagnosis <--- staging by medical oncology   CHIEF COMPLAINT: Here to discuss management of squamous cell carcinoma of neck   HISTORY OF PRESENT ILLNESS::Rawley V. 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    He self treated with azithromycin but it did not improve.  He noticed it about 4 months ago.  He ultimately sought medical attention.  Subsequently, the patient saw Dr. Benjamine Mola on 12/28/20 who ordered a neck CT on 01/07/21 and I personally reviewed those images. (No obvious mass or lesion was noted from his laryngoscopy examination.)  The neck CT on 01/07/21 showed a 3.2 cm heterogeneously enhancing mass within the left neck at Level II station.  The radiographic findings were suggestive of metastatic disease.  No other cervical lymphadenopathy was noted.  No definitive primary mass was noted within the oral cavity, pharynx, or  larynx as well.  The patient presented to Dr. Benjamine Mola on 01/18/21 for left tonsillectomy procedure with intraoperative frozen section analysis. (Patient reported that he and Dr. Benjamine Mola both palpated a mass in the left tonsil and therefore it was suspicious for the primary site ) Tonsillectomy revealed benign tissue.  Excision of the left neck soft tissue mass taken on 01/18/21 revealed: Moderate to poorly differentiated squamous cell carcinoma. p16 and p40 positive.  Extranodal extension was not mentioned in the comments and we are contacting pathology to gather data in this regard as well as to request EBV status  Pertinent imaging thus far includes PET scan performed on 02/05/21 revealing low level uptake identified in the left neck surgical bed; likely related to healing/granulation from tonsillectomy. Findings also showed a 6 mm pulmonary nodule, There was otherwise no evidence of hypermetabolic metastatic disease in the neck, chest, abdomen, or pelvis.     Swallowing issues, if any: none  Weight Changes: none, healthy appetite  Pain status: none  Other symptoms: firm mass is palpable within the left tonsillar fossa, reports having some urinary issues   Tobacco history, if any: Former smoker,  he reports that he quit smoking altogether 2 months ago.  He estimates a 9 to 9-1/2-year pack year history.  He reports that he started smoking more around retirement which started 7 years ago  ETOH abuse, if any: Recently he had about 3 drinks per day([place]) but now since diagnosis reports 1-2 drinks per day  Prior cancers, if any: none; he denies previous skin cancers but does report that a plastic  surgeon would see him every few months and perform " acid treatments" for seborrheic keratoses, particularly over the left scalp.  Never had any known suspicious lesions but none were analyzed by pathology.  He likes to hunt and golf in his free time.  PREVIOUS RADIATION THERAPY: No  PAST MEDICAL HISTORY:   has a past medical history of BPH (benign prostatic hyperplasia).    PAST SURGICAL HISTORY: Past Surgical History:  Procedure Laterality Date   CHOLECYSTECTOMY     DIRECT LARYNGOSCOPY N/A 01/18/2021   Procedure: DIRECT LARYNGOSCOPY;  Surgeon: Leta Baptist, MD;  Location: [facility];  Service: ENT;  Laterality: N/A;   KNEE ARTHROSCOPY W/ MENISCECTOMY Left    KNEE ARTHROSCOPY W/ MENISCECTOMY Right    MASS BIOPSY Left 01/18/2021   Procedure: LEFT NECK MASS EXCISION;  Surgeon: Leta Baptist, MD;  Location: [facility] [place];  Service: ENT;  Laterality: Left;   TONSILLECTOMY Left 01/18/2021   Procedure: TONSILLECTOMY WITH FROZEN SECTION;  Surgeon: Leta Baptist, MD;  Location: [facility];  Service: ENT;  Laterality: Left;    FAMILY HISTORY: family history includes Alzheimer's disease in his father; Cancer - Colon in his paternal grandfather; Coronary artery disease in his sister.  SOCIAL HISTORY:  reports that he has quit smoking. He has never used smokeless tobacco. He reports current alcohol use of about 10.0 standard drinks of alcohol per week. He reports that he does not use drugs.  ALLERGIES: Patient has no known allergies.  MEDICATIONS:  Current Outpatient Medications  Medication Sig Dispense Refill   finasteride (PROSCAR) 5 MG tablet Take 5 mg by mouth daily.     silodosin (RAPAFLO) 8 MG CAPS capsule Take 8 mg by mouth daily with breakfast.     No current facility-administered medications for this encounter.    REVIEW OF SYSTEMS:  Notable for that above.   PHYSICAL EXAM:  height is 6' 3.5" (1.918 m) and weight is 226 lb 8 oz (102.7 kg). His oral temperature is 99.1 F (37.3 C). His blood pressure is 129/78 and his pulse is 67. His respiration is 17 and oxygen saturation is 100%.   General: Alert and oriented, in no acute distress HEENT: Head is normocephalic. Extraocular movements are intact. Oropharynx is notable for healing tissue at site of left  tonsillectomy, no other lesions in the mouth or throat, dentition grossly in good condition. Neck: Neck is notable for induration at site of left neck excision, no palpable adenopathy Heart: Regular in rate and rhythm with no murmurs, rubs, or gallops. Chest: Clear to auscultation bilaterally, with no rhonchi, wheezes, or rales. Abdomen: Soft, nontender, nondistended, with no rigidity or guarding. Extremities: No cyanosis or edema. Lymphatics: see Neck Exam Skin: No concerning lesions. Musculoskeletal: symmetric strength and muscle tone throughout. Neurologic: Cranial nerves II through XII are grossly intact. No obvious focalities. Speech is fluent. Coordination is intact. Psychiatric: Judgment and insight are intact. Affect is appropriate.   ECOG = 0  0 - Asymptomatic (Fully active, able to carry on all predisease activities without restriction)  1 - Symptomatic but completely ambulatory (Restricted in physically strenuous activity but ambulatory and able to carry out work of a light or sedentary nature. For example, light housework, office work)  2 - Symptomatic, <50% in bed during the day (Ambulatory and capable of all self care but unable to carry out any work activities. Up and about more than 50% of waking hours)  3 - Symptomatic, >50% in bed, but not  bedbound (Capable of only limited self-care, confined to bed or chair 50% or more of waking hours)  4 - Bedbound (Completely disabled. Cannot carry on any self-care. Totally confined to bed or chair)  5 - Death   Eustace Pen MM, Creech RH, Tormey DC, et al. [id number]). "Toxicity and response criteria of the [facility]". [place] Oncol. 5 (6): 649-55   LABORATORY DATA:  No results found for: WBC, HGB, HCT, MCV, PLT CMP  No results found for: NA, K, CL, CO2, GLUCOSE, BUN, CREATININE, CALCIUM, PROT, ALBUMIN, AST, ALT, ALKPHOS, BILITOT, GFRNONAA, GFRAA    No results found for: TSH    RADIOGRAPHY: NM PET Image  Initial (PI) Skull Base To Thigh  Result Date: 02/05/2021 CLINICAL DATA:  Subsequent treatment strategy for head neck cancer. Status post tonsillectomy and excision of left neck mass on 01/18/2021. EXAM: NUCLEAR MEDICINE PET SKULL BASE TO THIGH TECHNIQUE: 10.9 mCi F-18 FDG was injected intravenously. Full-ring PET imaging was performed from the skull base to thigh after the radiotracer. CT data was obtained and used for attenuation correction and anatomic localization. Fasting blood glucose: 101 mg/dl COMPARISON:  Neck CT 01/07/2021 FINDINGS: Mediastinal blood pool activity: SUV max 2.8 Liver activity: SUV max NA NECK: FDG accumulation along the superior surface of the left sternocleidomastoid muscle is likely related to the recent tertiary. There is a tiny gas locule just posterior to the left sub mandibular gland, also likely related to surgery although residual soft tissue gas is only expected out to about 14 days, so this is more persistent than typically seen. There is some low level FDG accumulation in this region which is location of the excised mass seen previously. Incidental CT findings: none CHEST: No hypermetabolic mediastinal or hilar nodes. No suspicious pulmonary nodules on the CT scan. Small focus of uptake in the distal esophagus may be physiologic or related to esophagitis. Incidental CT findings: Atherosclerotic calcification is noted in the wall of the thoracic aorta. Centrilobular emphsyema noted. 6 mm right lung nodule identified on image 87/series 4. No overtly suspicious pulmonary nodule or mass. No focal airspace consolidation. There is no evidence of pleural effusion. ABDOMEN/PELVIS: No abnormal hypermetabolic activity within the liver, pancreas, adrenal glands, or spleen. No hypermetabolic lymph nodes in the abdomen or pelvis. Incidental CT findings: Diffuse low attenuation of liver parenchyma is compatible with steatosis. Gallbladder is surgically absent. Tiny nonobstructing stone noted  upper pole left kidney. Left colonic diverticulosis without diverticulitis. Prostate gland is enlarged. SKELETON: No focal hypermetabolic activity to suggest skeletal metastasis. No worrisome lytic or sclerotic osseous abnormality. Incidental CT findings: none IMPRESSION: 1. Low level uptake identified in the left neck surgical bed, likely related to healing/granulation. There is a tiny gas bubble in the surgical bed, likely related to the prior surgery although gas is unexpected after about postoperative day 14. As infection could present with soft tissue gas, close follow-up suggested. 2. No evidence for hypermetabolic metastatic disease in the neck, chest, abdomen, or pelvis. 3. 6 mm right pulmonary nodule.  Attention on follow-up recommended. 4. Hepatic steatosis. 5.  Emphysema. (ICD10-J43.9) 6.  Aortic Atherosclerois (ICD10-170.0) Electronically Signed   By: Misty Stanley M.D.   On: 02/05/2021 10:59      IMPRESSION/PLAN:  This is a delightful patient with head and neck cancer, p16 positive squamous cell carcinoma of the left cervical node with unknown primary.    Extranodal extension was not mentioned in the comments of the pathology report and we are contacting  pathology to gather data in this regard (imaging is concerning for this ) as well as to request EBV status   We are also referring the patient to dermatology to rule out any suspicious skin lesions as a source of primary disease  Also we are referring the patient to [facility] otolaryngology for panendoscopy and targeted biopsies to determine if there is a subclinical primary that can be determined in the throat.  While we are still gathering information, he understands that there is a good likelihood that radiation will play an important role in his treatment and that he would receive treatment to the highest risk mucosa in his throat (presumably oropharynx) and high/moderate at-risk lymph nodes.  He understands that extranodal extension,  if determined to be present, may warrant consideration of concurrent chemotherapy and a discussion once more with medical oncology.  We did briefly discussed TORS but he understands that this would not necessarily spare him from needing radiation therapy.  He is interested in de-escalating his treatment is much as possible.  He is also interested in being considered for ipsilateral neck radiation given that he is highly concerned about quality of life and side effects.  We will regroup once all of the information has been gathered regarding the status of his disease.  We discussed the potential risks, benefits, and side effects of radiotherapy. We talked in detail about acute and late effects. We discussed that some of the most bothersome acute effects may be mucositis, dysgeusia, salivary changes, skin irritation, hair loss, dehydration, weight loss and fatigue. We talked about late effects which include but are not necessarily limited to dysphagia, hypothyroidism, nerve injury, vascular injury, spinal cord injury, xerostomia, trismus, neck edema, and potential injury to any of the tissues in the head and neck region. No guarantees of treatment were given. A consent form was signed and placed in the patient's medical record. The patient is enthusiastic about proceeding with treatment. I look forward to participating in the patient's care.    We also discussed that the treatment of head and neck cancer is a multidisciplinary process to maximize treatment outcomes and quality of life. For this reason the following referrals have been or will be made:   Medical oncology to discuss chemotherapy    Dentistry for dental evaluation, possible extractions in the radiation fields, and /or advice on reducing risk of cavities, osteoradionecrosis, or other oral issues.   Nutritionist for nutrition support during and after treatment.   Speech language pathology for swallowing and/or speech therapy.   Social work  for social support.    Physical therapy due to risk of lymphedema in neck and deconditioning.   Baseline labs including TSH.   He knows not to resume smoking at any point nor any vaping, nor any chewing of tobacco.  Of note we also discussed the possibility of a feeding tube for nutrition.  He is highly motivated to take all of his nutrition by mouth and only pursue a feeding tube if absolutely necessary.  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  Our patient navigator, Anderson Malta, was present for the consultation and will help move his consultations, data acquisition, and referrals forward.  On date of service, in total, I spent 70 minutes on this encounter. Patient was seen in person.  __________________________________________   Eppie Gibson, MD  This document serves as a record of services personally performed by Judson Roch  Isidore Moos, MD. It was created on her behalf by Roney Mans, a trained medical scribe. The creation of this record is based on the scribe's personal observations and the provider's statements to them. This document has been checked and approved by the attending provider.

## 2021-02-14 NOTE — Patient Instructions (Addendum)
[place] at [facility] Discharge Instructions  You were seen and examined today by Dr. Delton Coombes. Dr. Delton Coombes discussed your past medical history, family history of cancer and the events that led to you being here today.  You have been diagnosed with Squamous Cell Carcinoma of the neck. The good news is that there was no evidence of cancer in your chest, abdomen or pelvis! Your cancer was P16 positive, this is good news and is associated with a good prognosis!  Dr. Delton Coombes will be reaching out to the pathologist to identify any role for chemotherapy. Dr. Delton Coombes would like to discuss your case on the tumor board, this is a multidisciplinary meeting that allows for multiple physicians to discuss the best course of action. Dr. Delton Coombes will let you know of those recommendations discussed. Dr. Delton Coombes will refer you to Dr. Isidore Moos, she is one of the Radiation Oncologist at [facility].   Thank you for choosing [place] at [facility] to provide your oncology and hematology care.  To afford each patient quality time with our provider, please arrive at least 15 minutes before your scheduled appointment time.   If you have a lab appointment with the [facility] please come in thru the Main Entrance and check in at the main information desk.  You need to re-schedule your appointment should you arrive 10 or more minutes late.  We strive to give you quality time with our providers, and arriving late affects you and other patients whose appointments are after yours.  Also, if you no show three or more times for appointments you may be dismissed from the clinic at the providers discretion.     Again, thank you for choosing [facility].  Our hope is that these requests will decrease the amount of time that you wait before being seen by our physicians.        _____________________________________________________________  Should you have questions after your visit to [facility], please contact our office at [phone] and follow the prompts.  Our office hours are 8:00 a.m. and 4:30 p.m. Monday - Friday.  Please note that voicemails left after 4:00 p.m. may not be returned until the following business day.  We are closed weekends and major holidays.  You do have access to a nurse 24-7, just call the main number to the clinic [phone] and do not press any options, hold on the line and a nurse will answer the phone.    For prescription refill requests, have your pharmacy contact our office and allow 72 hours.    Due to Covid, you will need to wear a mask upon entering the hospital. If you do not have a mask, a mask will be given to you at the Main Entrance upon arrival. For doctor visits, patients may have 1 support person age 61 or older with them. For treatment visits, patients can not have anyone with them due to social distancing guidelines and our immunocompromised population.

## 2021-02-14 NOTE — Progress Notes (Signed)
Head and Neck Cancer Location of Tumor / Histology:  Squamous cell carcinoma LEFT neck, p16(+)  Patient presented with symptoms of: noticed swelling of a left lymph node in the neck 4 months ago. He never had any previous biopsies. He has not had any changes to his voice, and denies any current unusual pains or trouble swallowing.  Biopsies revealed:  01/18/2021 FINAL MICROSCOPIC DIAGNOSIS:  A. TONSIL, LEFT, TONSILLECTOMY:  -  Benign tonsil  -  No carcinoma identified  B. SOFT TISSUE MASS, LEFT NECK, EXCISION:  -  Squamous cell carcinoma  -  See comment  COMMENT:  Squamous cell carcinoma is moderate to poorly differentiated.  By immunohistochemistry, it is positive for p16 and p40.  Nutrition Status Yes No Comments  Weight changes? []  [x]    Swallowing concerns? []  [x]    PEG? []  [x]     Referrals Yes No Comments  Social Work? [x]  []    Dentistry? [x]  []    Swallowing therapy? [x]  []    Nutrition? [x]  []    Med/Onc? [x]  []  Dr. Derek Jack   Safety Issues Yes No Comments  Prior radiation? []  [x]    Pacemaker/ICD? []  [x]    Possible current pregnancy? []  [x]  N/A  Is the patient on methotrexate? []  [x]     Tobacco/Marijuana/Snuff/ETOH use: Former smoker (used to smoke ~1/2 pack/day for 19 years). Reports occasional alcohol consumption. Denies any smokeless tobacco use or recreational drug use  Past/Anticipated interventions by otolaryngology, if any:  01/18/2021 Dr. Leta Baptist 1. Left level 2 deep neck mass excision 2. Left tonsillectomy 3. Direct laryngoscopy  Past/Anticipated interventions by medical oncology, if any:  Under care of Dr. Derek Jack 02/14/2021 - Presentation with 85-month history of lymph node in the left neck. - Evaluated by Dr. Benjamine Mola on fiberoptic examination and examination with rigid scope were negative. - CT soft tissue neck on 01/07/2021 shows 3.1 x 3.2 cm heterogeneously enhancing mass within the left neck at level 2 station.  No other cervical  adenopathy. - Dr. Benjamine Mola felt abnormality in the left tonsil. - He underwent left tonsillectomy and left neck lymph node excision on 01/18/2021. - Pathology consistent with squamous cell carcinoma moderately to poorly differentiated in the left neck lymph node.  This was positive for p16 and p40.  Left tonsil was benign. - PET scan on 02/05/2021 shows low-level uptake identified in the left neck surgical bed with a tiny gas bubble in the bed.  No evidence for hypermetabolic metastatic disease in the neck, chest, abdomen or pelvis.  6 mm right pulmonary nodule. -I have talked to Dr. Benjamine Mola and confirmed fiberoptic exam and a rigid scope exam which was negative for any suspicious lesions. - I have also talked to Dr. Melina Copa.  There is no extracapsular extension and the lymph node is surrounded by thick capsule. - We will make a referral to Dr. Isidore Moos for radiation consultation. - Pathology shows 1 adverse feature with size of the lymph node more than 3 cm.  However the definition of adverse feature is not clear in the context of HPV positive disease.  We will present his case at the tumor board.   Current Complaints / other details:  Patient has received the first 2 Pfizer vaccines

## 2021-02-15 ENCOUNTER — Encounter: Payer: Self-pay | Admitting: Radiation Oncology

## 2021-02-15 ENCOUNTER — Ambulatory Visit
Admission: RE | Admit: 2021-02-15 | Discharge: 2021-02-15 | Disposition: A | Discharge status: Home / Self Care | Payer: BC Managed Care – PPO | Source: Ambulatory Visit | Attending: Radiation Oncology | Admitting: Radiation Oncology

## 2021-02-15 ENCOUNTER — Other Ambulatory Visit: Payer: Self-pay

## 2021-02-15 VITALS — BP 129/78 | HR 67 | Temp 99.1°F | Resp 17 | Ht 75.5 in | Wt 226.5 lb

## 2021-02-15 DIAGNOSIS — C109 Malignant neoplasm of oropharynx, unspecified: Secondary | ICD-10-CM

## 2021-02-15 DIAGNOSIS — Z800 Family history of malignant neoplasm of digestive organs: Secondary | ICD-10-CM | POA: Diagnosis not present

## 2021-02-15 DIAGNOSIS — C4442 Squamous cell carcinoma of skin of scalp and neck: Secondary | ICD-10-CM | POA: Diagnosis not present

## 2021-02-15 DIAGNOSIS — Z87891 Personal history of nicotine dependence: Secondary | ICD-10-CM | POA: Diagnosis not present

## 2021-02-15 DIAGNOSIS — R911 Solitary pulmonary nodule: Secondary | ICD-10-CM | POA: Diagnosis not present

## 2021-02-15 DIAGNOSIS — Z801 Family history of malignant neoplasm of trachea, bronchus and lung: Secondary | ICD-10-CM | POA: Insufficient documentation

## 2021-02-15 DIAGNOSIS — C770 Secondary and unspecified malignant neoplasm of lymph nodes of head, face and neck: Secondary | ICD-10-CM | POA: Insufficient documentation

## 2021-02-15 NOTE — Progress Notes (Signed)
Oncology Nurse Navigator Documentation   At Dr. Pearlie Oyster request I called [facility] and spoke with Varney Biles to request add-ons to Mr. Boehning recent pathology from 01/18/21. I requested EBV testing be added and an addendum regarding if Extra Capsular Extention was present on the biopsied lymph node. Varney Biles verbalized that she would send those requests to the Pathologist.   Harlow Asa RN, BSN, OCN Head & Neck Oncology Nurse [place] at [facility] Phone # [phone]  Fax # [phone]

## 2021-02-15 NOTE — Progress Notes (Signed)
Oncology Nurse Navigator Documentation   Met with patient during initial consult with Dr. Isidore Moos.  I introduced myself as his Navigator, explained my role as a member of the Care Team. Provided New Patient Information packet: Contact information for physician, this navigator, other members of the Care Team Advance Directive information ([place] blue pamphlet with LCSW insert); provided [facility] AD booklet at his request,  Fall Prevention Patient [place] Information sheet Symptom Management Clinic information [facility] campus map with highlight of [place] SLP Information sheet Assisted with post-consult appt scheduling. He is aware that he will receive a call from Dr. Raynelle Dick office for an appointment.  I have also referred him to the ENT surgeon's at [facility]. I have also placed a referral to Dermatology.  I will also call pathology at the request of Dr. Isidore Moos for additions that she would like to his pathology report.  He verbalized understanding of information provided. I encouraged him to call with questions/concerns moving forward.  Harlow Asa, RN, BSN, OCN Head & Neck Oncology Nurse [place] at [place] [phone]

## 2021-02-19 LAB — SURGICAL PATHOLOGY

## 2021-02-25 DIAGNOSIS — D2261 Melanocytic nevi of right upper limb, including shoulder: Secondary | ICD-10-CM | POA: Diagnosis not present

## 2021-02-25 DIAGNOSIS — L821 Other seborrheic keratosis: Secondary | ICD-10-CM | POA: Diagnosis not present

## 2021-02-25 DIAGNOSIS — B078 Other viral warts: Secondary | ICD-10-CM | POA: Diagnosis not present

## 2021-02-25 DIAGNOSIS — D225 Melanocytic nevi of trunk: Secondary | ICD-10-CM | POA: Diagnosis not present

## 2021-02-25 DIAGNOSIS — D485 Neoplasm of uncertain behavior of skin: Secondary | ICD-10-CM | POA: Diagnosis not present

## 2021-02-25 DIAGNOSIS — D2262 Melanocytic nevi of left upper limb, including shoulder: Secondary | ICD-10-CM | POA: Diagnosis not present

## 2021-02-26 ENCOUNTER — Ambulatory Visit (INDEPENDENT_AMBULATORY_CARE_PROVIDER_SITE_OTHER): Payer: BC Managed Care – PPO | Admitting: Dentistry

## 2021-02-26 ENCOUNTER — Encounter (HOSPITAL_COMMUNITY): Payer: Self-pay | Admitting: Dentistry

## 2021-02-26 ENCOUNTER — Other Ambulatory Visit: Payer: Self-pay

## 2021-02-26 DIAGNOSIS — K032 Erosion of teeth: Secondary | ICD-10-CM

## 2021-02-26 DIAGNOSIS — K030 Excessive attrition of teeth: Secondary | ICD-10-CM

## 2021-02-26 DIAGNOSIS — K06020 Generalized gingival recession, unspecified: Secondary | ICD-10-CM

## 2021-02-26 DIAGNOSIS — K029 Dental caries, unspecified: Secondary | ICD-10-CM

## 2021-02-26 DIAGNOSIS — Z01818 Encounter for other preprocedural examination: Secondary | ICD-10-CM

## 2021-02-26 DIAGNOSIS — Z91849 Unspecified risk for dental caries: Secondary | ICD-10-CM

## 2021-02-26 DIAGNOSIS — C770 Secondary and unspecified malignant neoplasm of lymph nodes of head, face and neck: Secondary | ICD-10-CM

## 2021-02-26 DIAGNOSIS — C109 Malignant neoplasm of oropharynx, unspecified: Secondary | ICD-10-CM

## 2021-02-26 DIAGNOSIS — K0850 Unsatisfactory restoration of tooth, unspecified: Secondary | ICD-10-CM

## 2021-02-26 NOTE — Progress Notes (Signed)
Department of Dental Medicine      OUTPATIENT CONSULT  Service Date:   02/26/2021  Patient Name:   Taylor Turner. Taylor Turner Date of Birth:   11/12/1955 Medical Record Number: [id number]  Referring Provider:                Eppie Gibson, MD        TODAY'S VISIT:   Assessment:   There are no current signs of acute odontogenic infection including abscess, edema or erythema, or suspicious lesion requiring biopsy.  Recommendations:   No dental intervention indicated prior to radiation at this time.  Establish dental care at an outside office of the patient's choice for routine care including cleanings/periodontal therapy and periodic exams. Plan:   Discuss case with medical team and coordinate treatment as needed. Follow-up in our clinic after the completion of radiation therapy.  Discussed in detail all treatment options and recommendations with the patient and they are agreeable to the plan.    Thank you for consulting with [facility] and for the opportunity to participate in this patient's treatment.  Should you have any questions or concerns, please contact the [facility] at [phone].        PROGRESS NOTE:   COVID-19 SCREENING:  The patient denies symptoms concerning for COVID-19 infection including fever, chills, cough, or newly developed shortness of breath.   HISTORY OF PRESENT ILLNESS: Taylor Turner. Richert is a very pleasant 65 y.o. 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  The patient presents today for a medically necessary dental consultation as part of their pre-radiation therapy work-up.   DENTAL HISTORY: The patient reports he does go to the dentist routinely every 6 months for cleanings and exams.  His dentist is in [place], [place] and has recently retired so he is considering  establishing care at another office closer to [place] where he lives.  He currently denies any dental/orofacial pain or sensitivity. Patient is able to manage oral secretions.  Patient denies dysphagia, odynophagia, dysphonia, SOB and neck pain.  Patient denies fever, rigors and malaise.   CHIEF COMPLAINT:  Here for pre-head and neck radiation dental exam.   Patient Active Problem List   Diagnosis Date Noted   Secondary malignant neoplasm of cervical lymph node ([place]) 02/15/2021   Squamous cell carcinoma of oropharynx ([place]) 02/14/2021   Past Medical History:  Diagnosis Date   BPH (benign prostatic hyperplasia)    Past Surgical History:  Procedure Laterality Date   CHOLECYSTECTOMY     DIRECT LARYNGOSCOPY N/A 01/18/2021   Procedure: DIRECT LARYNGOSCOPY;  Surgeon: Leta Baptist, MD;  Location: [facility];  Service: ENT;  Laterality: N/A;   KNEE ARTHROSCOPY W/ MENISCECTOMY Left    KNEE ARTHROSCOPY W/ MENISCECTOMY Right    MASS BIOPSY Left 01/18/2021   Procedure: LEFT NECK MASS EXCISION;  Surgeon: Leta Baptist, MD;  Location: [facility] [facility];  Service: ENT;  Laterality: Left;   TONSILLECTOMY Left 01/18/2021   Procedure: TONSILLECTOMY WITH FROZEN SECTION;  Surgeon: Leta Baptist, MD;  Location: [place];  Service: ENT;  Laterality: Left;   No Known Allergies Current Outpatient Medications  Medication Sig Dispense Refill   finasteride (PROSCAR) 5 MG tablet Take 5 mg by mouth daily.     silodosin (RAPAFLO) 8 MG CAPS capsule Take 8 mg by  mouth daily with breakfast.     No current facility-administered medications for this visit.    LABS: No results found for: WBC, HGB, HCT, MCV, PLT No results found for: NA, K, CL, CO2, GLUCOSE, BUN, CREATININE, CALCIUM, GFRNONAA, GFRAA No results found for: INR, PROTIME No results found for: PTT  Social History   Socioeconomic History   Marital status: Single    Spouse name: Not on file   Number of children: Not on  file   Years of education: Not on file   Highest education level: Not on file  Occupational History   Not on file  Tobacco Use   Smoking status: Former    Pack years: 0.00   Smokeless tobacco: Never   Tobacco comments:    occ. cigar  Substance and Sexual Activity   Alcohol use: Yes    Alcohol/week: 10.0 standard drinks    Types: 10 Standard drinks or equivalent per week   Drug use: Never   Sexual activity: Not on file  Other Topics Concern   Not on file  Social History Narrative   Not on file   Social Determinants of Health   Financial Resource Strain: Low Risk    Difficulty of Paying Living Expenses: Not hard at all  Food Insecurity: No Food Insecurity   Worried About Charity fundraiser in the Last Year: Never true   [place] in the Last Year: Never true  Transportation Needs: No Transportation Needs   Lack of Transportation (Medical): No   Lack of Transportation (Non-Medical): No  Physical Activity: Insufficiently Active   Days of Exercise per Week: 2 days   Minutes of Exercise per Session: 60 min  Stress: No Stress Concern Present   Feeling of Stress : Not at all  Social Connections: Socially Integrated   Frequency of Communication with Friends and Family: More than three times a week   Frequency of Social Gatherings with Friends and Family: More than three times a week   Attends Religious Services: 1 to 4 times per year   Active Member of [employer] or Organizations: Yes   Attends Music therapist: More than 4 times per year   Marital Status: Married  Human resources officer Violence: Not At Risk   Fear of Current or Ex-Partner: No   Emotionally Abused: No   Physically Abused: No   Sexually Abused: No   Family History  Problem Relation Age of Onset   Alzheimer's disease Father    Coronary artery disease Sister    Cancer - Colon Paternal Grandfather      REVIEW OF SYSTEMS:  Reviewed with the patient as per HPI. Psych: Patient denies having  dental phobia.   VITAL SIGNS: BP (!) 141/78 (BP Location: Right Arm, Patient Position: Sitting, Cuff Size: Normal)   Pulse (!) 56   Temp 98.3 F (36.8 C) (Oral)    PHYSICAL EXAM: General:  Well-developed, comfortable and in no apparent distress. Neurological:  Alert and oriented to person, place and  time. Extraoral:  Facial symmetry present without any edema or erythema.  No swelling.  TMJ asymptomatic without clicks or crepitations. Maximum Interincisal Opening:  60 mm Intraoral:  Soft tissues appear well-perfused and mucous membranes moist; slightly viscous/foamy saliva of less quantity.  FOM and vestibules soft and not raised. Oral cavity without mass or lesion. No signs of infection, parulis, sinus tract, edema or erythema evident upon exam.   DENTAL EXAM: Hard tissue exam completed and charted. Overall impression:  Good remaining dentition Oral hygiene:  Good   Periodontal:  Pink, healthy gingival tissue with blunted papilla. Generalized gingival recession. Incipient lesions (monitor):  #2, #3, #13, #14, #19, #20, #30 Defective restorations:  #19 has occlusal discoloration that catches with explorer on existing composite- possible compromised restoration and may need to be replaced in the future Occlusion:  Class I molar occlusion. Slight crowding of mandibular anterior teeth. Other findings:  Attrition/wear- #23-#26 incisal. Abfraction(s)- #13B(V), #14B(V), #15B(V). Staining- #2O, #5O, #12O, #18O, #28O, #29O and #30O (recommend to monitor)   RADIOGRAPHIC EXAM:  PAN taken in 03/2020 (imported from outside dental office) interpreted. Full Mouth Series exposed and interpreted.  Condyles seated bilaterally in fossas.  No evidence of abnormal pathology.  All visualized osseous structures appear WNL. 32 teeth evident and all are fully erupted.  Generalized mild horizontal bone loss consistent with mild periodontitis.  Lesions evident on DEJ- #67M, #3D, #13D, #14D, #29M, #20D,  #63M&D. Existing restorations #2, #3, #15 and #19. Attrition/wear evident on incisal edges of mandibular anterior teeth.   ASSESSMENT:  1.  SCC of cervical lymph node with unknown primary origin 2.  Pre-(head and neck )radiation dental exam 3.  Gingival recession, generalized 4.  Attrition/wear 5.  Abfraction/flexure 6.  Caries/lesions to monitor 7.  Sub-optimal dental restoration 8.  At-risk for caries   PROCEDURES: The common and significant side effects of radiation therapy to the head and neck were explained and discussed with the patient.  The discussion included side effects of trismus (limited opening), dysgeusia (loss of taste), xerostomia (dry mouth), radiation caries and osteoradionecrosis of the jaw.  I also discussed the importance of maintaining optimal oral hygiene and oral health before, during and after radiation to decrease the risk of developing radiation cavities and the need for any surgery such as extractions after therapy.    Trismus appliance made using patient's baseline MIO (31 sticks).  Leta Speller, DAII demonstrated use of appliance.  Verbal and written postop instructions were given to the patient.   PLAN AND RECOMMENDATIONS: I discussed the risks, benefits, and complications of various scenarios with the patient in relationship to their medical and dental conditions, which included systemic infection or other serious issues such as osteoradionecrosis that could potentially occur either before, during or after their anticipated radiation therapy if dental/oral concerns are not addressed.  I explained that if any chronic or acute dental/oral infection(s) are addressed and subsequently not maintained following medical optimization and recovery, their risk of the previously mentioned complications are just as high and could potentially occur postoperatively.  I explained all significant findings of the dental consultation with the patient including mild  xerostomia/thicker saliva clinically evident and the increased risk for caries, and the recommended care including to continue to see a dentist every 4-6 months for cleanings, exams to monitor smaller cavities and other indicated treatment in order to optimize his oral health and minimize any side effects previously discussed following radiation.  The patient verbalized understanding of all findings, discussion, and recommendations. We then discussed various treatment options relevant to the patient's case.  The patient verbalized understanding of all options, and currently wishes to proceed with continuing to see a dentist (either continue to go to his primary dental office or establish care at a local office) routinely for cleanings, exams and other indicated dental treatment following radiation therapy. Plan to discuss all findings and recommendations with medical team and coordinate future care as needed.  Plan to follow-up with the patient in our  clinic for another appointment after he finishes radiation therapy, and then subsequently refer back to primary dentist.  All questions and concerns were invited and addressed.  The patient tolerated today's visit well and departed in stable condition.  I spent in excess of 120 minutes during the conduct of this consultation and >50% of this time involved direct face-to-face encounter for counseling and/or coordination of the patient's care.  Roscoe Benson [place], D.M.D.

## 2021-02-26 NOTE — Patient Instructions (Signed)
[facility] Department of Dental Medicine Nyrah Demos B. Carlos Quackenbush, D.M.D. Phone: [phone] Fax: [phone]   It was a pleasure seeing you today!  Please refer to the information below regarding your dental visit with us.  Call if you have any questions or concerns that come up after you leave.   Thank you for letting us provide care for you.  If there is anything we can do for you, please let us know.    RADIATION THERAPY AND INFORMATION REGARDING YOUR TEETH   XEROSTOMIA (DRY MOUTH):  Your salivary glands may be in the field of radiation.  Radiation may include all or only part of your salivary glands.  This will cause your saliva to dry up, and you will have a dry mouth.  The dry mouth will be for the rest of your life unless your radiation oncologist tells you otherwise.  Your saliva has many functions: It wets your tongue for speaking. It coats your teeth and the inside of your mouth for easier movement. It helps with chewing and swallowing food. It helps clean away harmful acid and toxic products made by the germs in your mouth, therefore it helps prevent cavities. It kills some germs in your mouth and helps to prevent gum disease. It helps to carry flavor to your taste buds.  Once you have lost your saliva, you will be at higher risk for tooth decay and gum disease.    What can be done to help improve your mouth when there's not enough saliva? Your dentist may give a recommendation for CLoSYS.  It will not bring back all of your saliva but may bring back some of it.  Also, your saliva may be thick and ropy or white and foamy.  It will not feel like it use to feel. You will need to swish with water every time your mouth feels dry.  YOU CANNOT suck on any cough drops, mints, lemon drops, candy, vitamin C or any other products.  You cannot use anything other than water to make your mouth feel less dry.  If you want to drink anything else, you have to drink it all at once and brush  afterwards.  Be sure to discuss the details of your diet habits with your dentist or hygienist.   RADIATION CARIES:  This is decay (cavities) that happens very quickly once your mouth is very dry due to radiation therapy.  Normally, cavities take six months to two years to become a problem.  When you have dry mouth, cavities may take as little as eight weeks to cause you a problem.    Dental check-ups every two months are necessary as long as you have a dry mouth. Radiation caries typically, but not always, start at your gum line where it is hard to see the cavity.  It is therefore also hard to fill these cavities adequately.  This high rate of cavities happens because your mouth no longer has saliva and therefore the acid made by the germs starts the decay process.  Whenever you eat anything the germs in your mouth change the food into acid.  The acid then burns a small hole in your tooth.  This small hole is the beginning of a cavity.  If this is not treated then it will grow bigger and become a cavity.  The way to avoid this hole getting bigger is to use fluoride every evening as prescribed by your dentist following your radiation. NOTE:  You have to make sure   that your teeth are very clean before you use the fluoride.  This fluoride in turn will strengthen your teeth and prepare them for another day of fighting acid. If you develop radiation caries many times, the damage is so large that you will have to have all your teeth removed.  This could be a big problem if some of these teeth are in the field of radiation.  Further details of why this could be a big problem will follow (see Osteoradionecrosis below).   DYSGEUSIA (LOSS OF TASTE):  This happens to varying degrees once you've had radiation therapy to your jaw region.  Many times taste is not completely lost, but becomes limited.  The loss of taste is mostly due to radiation affecting your taste buds.  However, if you have no saliva in your mouth  to carry the flavor to your taste buds, it would be difficult for your taste buds to taste anything.  That is why using water or a prescription for Sal[place] prior to meals and during meal times may help with some of the taste.  Keep in mind that taste generally returns very slowly over the course of several months or several years after radiation therapy.  Don't give up hope.   TRISMUS (LIMITED JAW OPENING):  According to your radiation oncologist, your TMJ or jaw joints are going to be partially or fully in the field of radiation.  This means that over time the muscles that help you open and close your mouth may get stiff.  This will potentially result in your not being able to open your mouth wide enough or as wide as you can open it now.    Let me give you an example of how slowly this happens and how unaware people are of it:   A gentlemen that had radiation therapy two years ago came back to me complaining that bananas are just too large for him to be able to fit them in between his teeth.  He was not able to open wide enough to bite into a banana.  This happens slowly and over a period of time.  What we do to try and prevent this:   Your dentist will probably give you a stack of sticks called a trismus exercise device.  This stack will help remind your muscles and your jaw joints to open up to the same distance every day.  Use these sticks every morning when you wake up, or according to the instructions given by your dentist.    You must use these sticks for at least one to two years after radiation therapy.  The reason for that is because it happens so slowly and keeps going on for about two years after radiation therapy.  Your hospital dentist will help you monitor your mouth opening and make sure that it's not getting smaller after radiation.  TRISMUS EXERCISES: Using the stack of sticks given to you by your dentist, place the stack in your mouth and hold onto the other end for support. Leave  the sticks in your mouth while holding the other end.  Allow 30 seconds for muscle stretching. Rest for a few seconds. Repeat 3-5 times. This exercise is recommended in the mornings and evenings unless otherwise instructed. The exercise should be done for a period of 2 YEARS after the end of radiation. Your maximum jaw opening should be checked regularly at recall dental visits by your general dentist. You should report any changes, soreness, or difficulties encountered   when doing the exercises to your dentist.   OSTEORADIONECROSIS (ORN):  This is a condition where your jaw bone after radiation therapy becomes very dry.  It has very little blood supply to keep it alive.  If you develop a cavity that turns into an abscess or an infection, then the jaw bone does not have enough blood supply to help fight the infection.  At this point it is very likely that the infection could cause the death of your jaw bone.  When you have dead bone it has to be removed.  Therefore, you might end up having to have surgery to remove part of your jaw bone, the part of the jaw bone that has been affected.     Healing is also a problem if you are to have surgery (like a tooth extraction) in the areas where the bone has had radiation therapy.  If you have surgery, you need more blood supply to heal which is not available.  When blood supply and oxygen are not available, there is a chance for the bone to die. Occasionally, ORN happens on its own with no obvious reason, but this is quite rare.  We believe that patients who continue to smoke and/or drink alcohol have a higher chance of having this problem. Once your jaw bone has had radiation therapy, if there are any remaining teeth in that area, it is not recommended to have them pulled unless your dentist or oral surgeon is aware of your history of radiation and believes it is safe.  The risks for ORN either from infection or spontaneously occurring (with no reason) are life  long.   QUESTIONS? Call our office during office hours at [phone].  

## 2021-02-28 DIAGNOSIS — C7989 Secondary malignant neoplasm of other specified sites: Secondary | ICD-10-CM | POA: Diagnosis not present

## 2021-03-06 DIAGNOSIS — C801 Malignant (primary) neoplasm, unspecified: Secondary | ICD-10-CM | POA: Diagnosis not present

## 2021-03-06 DIAGNOSIS — R911 Solitary pulmonary nodule: Secondary | ICD-10-CM | POA: Diagnosis not present

## 2021-03-06 DIAGNOSIS — Z9889 Other specified postprocedural states: Secondary | ICD-10-CM | POA: Diagnosis not present

## 2021-03-06 DIAGNOSIS — C7989 Secondary malignant neoplasm of other specified sites: Secondary | ICD-10-CM | POA: Diagnosis not present

## 2021-03-06 DIAGNOSIS — C770 Secondary and unspecified malignant neoplasm of lymph nodes of head, face and neck: Secondary | ICD-10-CM | POA: Diagnosis not present

## 2021-03-06 DIAGNOSIS — Z9089 Acquired absence of other organs: Secondary | ICD-10-CM | POA: Diagnosis not present

## 2021-03-29 DIAGNOSIS — C801 Malignant (primary) neoplasm, unspecified: Secondary | ICD-10-CM | POA: Diagnosis not present

## 2021-03-29 DIAGNOSIS — R590 Localized enlarged lymph nodes: Secondary | ICD-10-CM | POA: Diagnosis not present

## 2021-03-29 DIAGNOSIS — B977 Papillomavirus as the cause of diseases classified elsewhere: Secondary | ICD-10-CM | POA: Diagnosis not present

## 2021-03-29 DIAGNOSIS — C109 Malignant neoplasm of oropharynx, unspecified: Secondary | ICD-10-CM | POA: Diagnosis not present

## 2021-03-29 DIAGNOSIS — C770 Secondary and unspecified malignant neoplasm of lymph nodes of head, face and neck: Secondary | ICD-10-CM | POA: Diagnosis not present

## 2021-03-30 DIAGNOSIS — C801 Malignant (primary) neoplasm, unspecified: Secondary | ICD-10-CM | POA: Diagnosis not present

## 2021-03-30 DIAGNOSIS — C770 Secondary and unspecified malignant neoplasm of lymph nodes of head, face and neck: Secondary | ICD-10-CM | POA: Diagnosis not present

## 2021-05-01 ENCOUNTER — Inpatient Hospital Stay (HOSPITAL_COMMUNITY): Payer: BC Managed Care – PPO | Admitting: Hematology

## 2021-05-01 DIAGNOSIS — C109 Malignant neoplasm of oropharynx, unspecified: Secondary | ICD-10-CM

## 2021-05-01 DIAGNOSIS — C4442 Squamous cell carcinoma of skin of scalp and neck: Secondary | ICD-10-CM

## 2021-05-27 DIAGNOSIS — C01 Malignant neoplasm of base of tongue: Secondary | ICD-10-CM | POA: Diagnosis not present

## 2021-05-27 DIAGNOSIS — C024 Malignant neoplasm of lingual tonsil: Secondary | ICD-10-CM | POA: Diagnosis not present

## 2021-05-27 DIAGNOSIS — Z87891 Personal history of nicotine dependence: Secondary | ICD-10-CM | POA: Diagnosis not present

## 2021-05-27 DIAGNOSIS — C770 Secondary and unspecified malignant neoplasm of lymph nodes of head, face and neck: Secondary | ICD-10-CM | POA: Diagnosis not present

## 2021-08-19 DIAGNOSIS — C099 Malignant neoplasm of tonsil, unspecified: Secondary | ICD-10-CM | POA: Diagnosis not present

## 2021-12-17 DIAGNOSIS — Z8579 Personal history of other malignant neoplasms of lymphoid, hematopoietic and related tissues: Secondary | ICD-10-CM | POA: Diagnosis not present

## 2021-12-17 DIAGNOSIS — Z08 Encounter for follow-up examination after completed treatment for malignant neoplasm: Secondary | ICD-10-CM | POA: Diagnosis not present

## 2021-12-17 DIAGNOSIS — Z85818 Personal history of malignant neoplasm of other sites of lip, oral cavity, and pharynx: Secondary | ICD-10-CM | POA: Diagnosis not present

## 2021-12-17 DIAGNOSIS — Z87891 Personal history of nicotine dependence: Secondary | ICD-10-CM | POA: Diagnosis not present

## 2021-12-17 DIAGNOSIS — Z9089 Acquired absence of other organs: Secondary | ICD-10-CM | POA: Diagnosis not present

## 2021-12-17 DIAGNOSIS — C024 Malignant neoplasm of lingual tonsil: Secondary | ICD-10-CM | POA: Diagnosis not present

## 2022-02-27 IMAGING — CT NM PET TUM IMG INITIAL (PI) SKULL BASE T - THIGH
7 series · 25 of 25 positions shown · non-contrast
Comparison: Neck CT 01/07/2021

CLINICAL DATA: Subsequent treatment strategy for head neck cancer.
Status post tonsillectomy and excision of left neck mass on
01/18/2021.

EXAM:
NUCLEAR MEDICINE PET SKULL BASE TO THIGH
TECHNIQUE: 10.9 mCi F-18 FDG was injected intravenously. Full-ring PET imaging
was performed from the skull base to thigh after the radiotracer. CT
data was obtained and used for attenuation correction and anatomic
localization.
Fasting blood glucose: 101 mg/dl

[Series 3: pet hn_sk_thigh ac · axial · 5.0mm · 4.07mm/px · z∈[-1112,-108]mm · 6 of 252 slices shown]
[im 1/252]
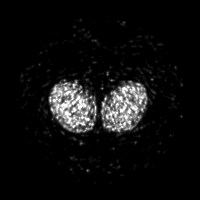
[im 51/252]
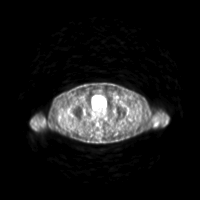
[im 101/252]
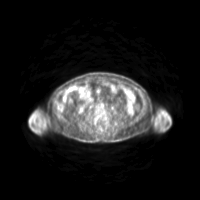
[im 151/252]
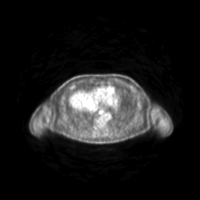
[im 201/252]
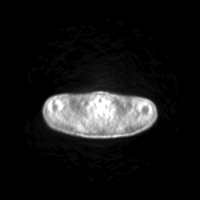
[im 252/252]
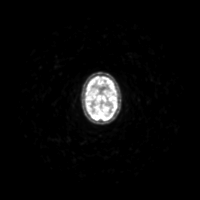

[Series 4: ct hn_sk_th 5.0 hd_fov · axial · 5.0mm · 1.17mm/px · z∈[-1112,-108]mm · 5 of 252 slices shown]
[im 1/252]
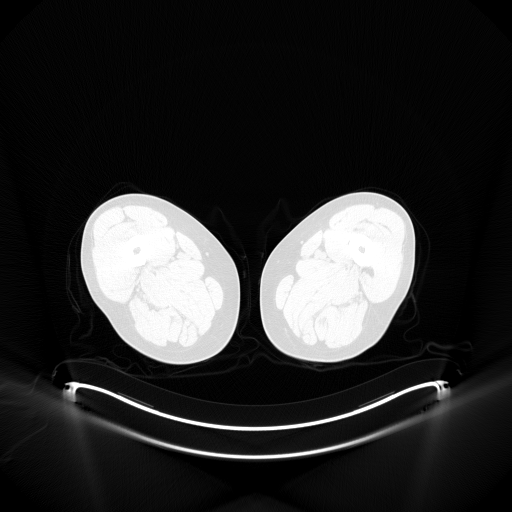
[im 63/252]
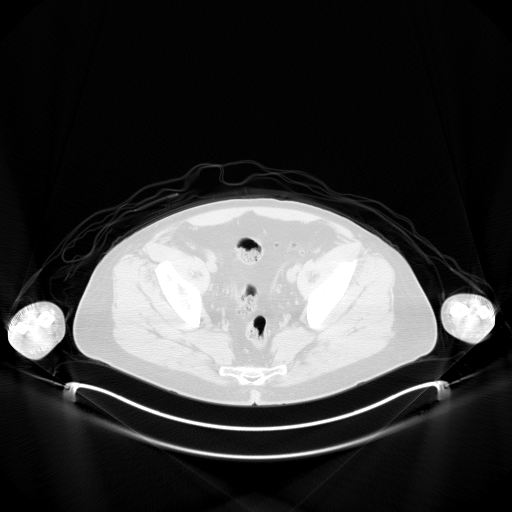
[im 126/252]
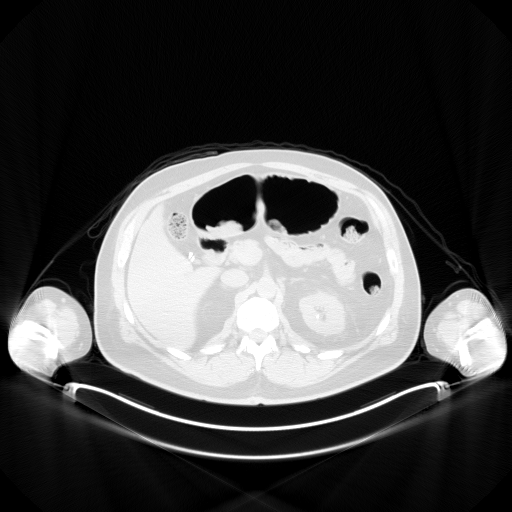
[im 189/252  brain]
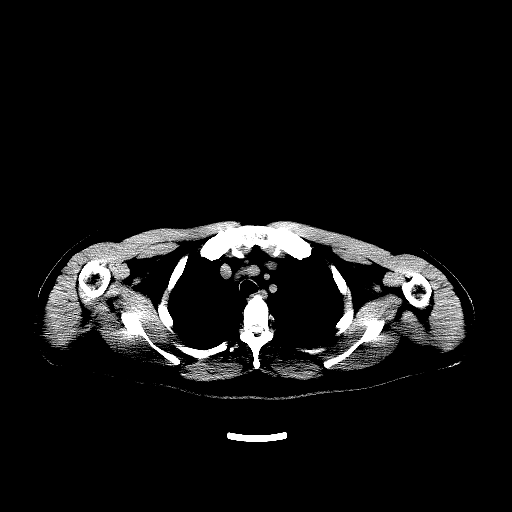
[im 252/252  brain]
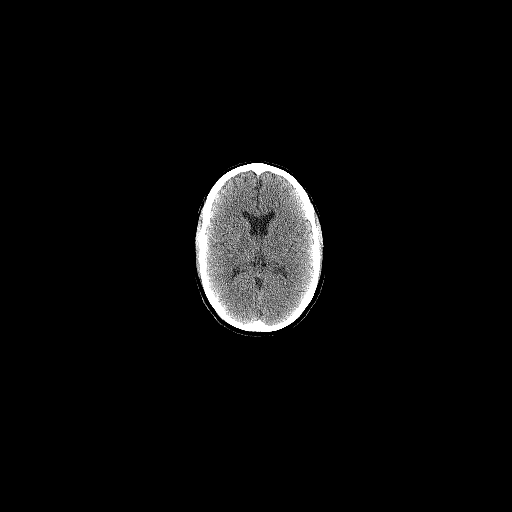

[Series 5: pet hn_sk_thigh nac · axial · 5.0mm · 4.07mm/px · z∈[-1112,-108]mm · 5 of 252 slices shown]
[im 1/252]
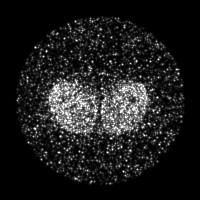
[im 63/252]
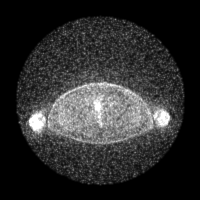
[im 126/252]
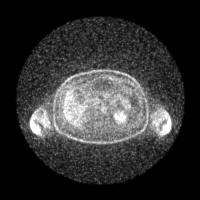
[im 189/252]
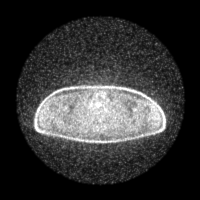
[im 252/252]
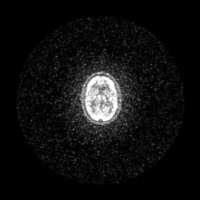

[Series 10: ct hn_sk_th 5.0 br59 (id)_bone · axial · 5.0mm · 0.75mm/px · z∈[-552,-272]mm · 2 of 71 slices shown]
[im 1/71]
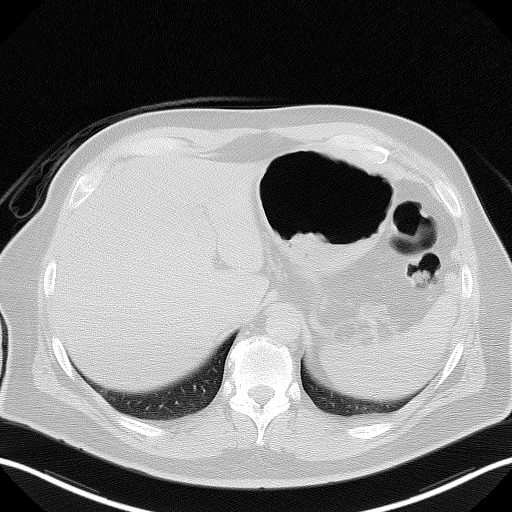
[im 71/71]
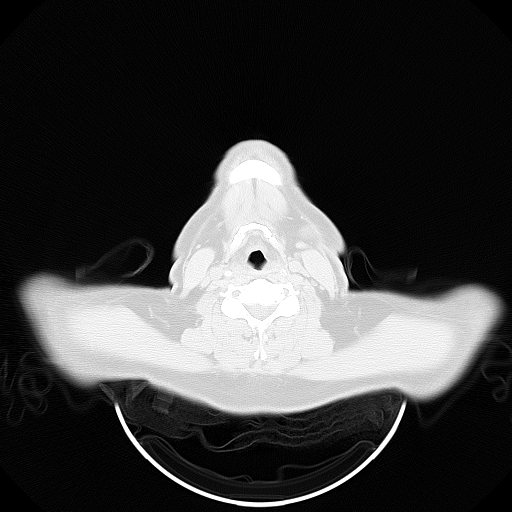

[Series 603: <mip collection> · coronal · 2.08mm/px · 1 of 32 slices shown]
[im 1/32]
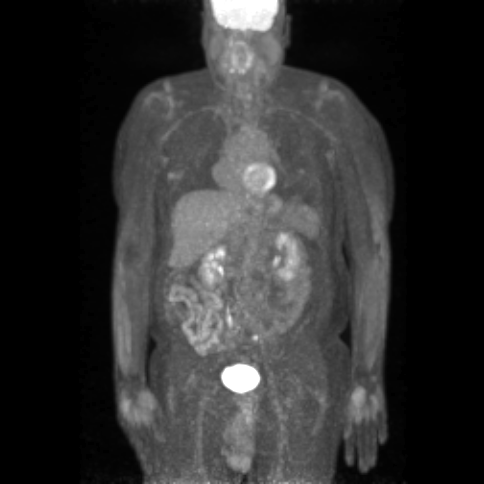

[Series 604: fused cor · 1 of 53 slices shown]
[im 1/53]
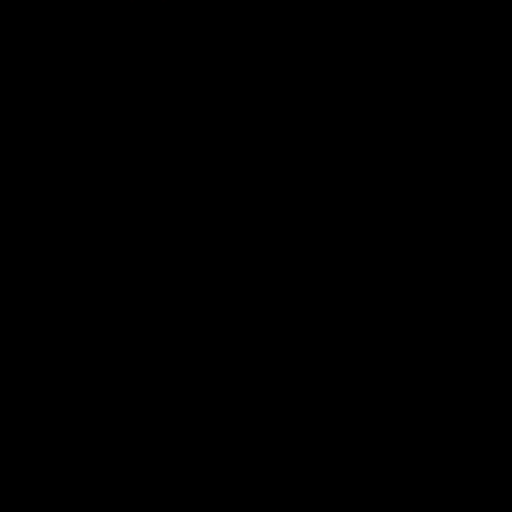

[Series 605: range-ac ct hn_sk_th 5.0 hd_fov-tra-<alpha range> · 5 of 242 slices shown]
[im 1/242]
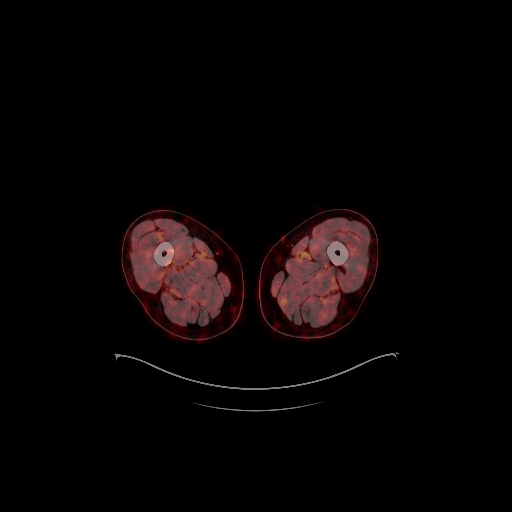
[im 61/242]
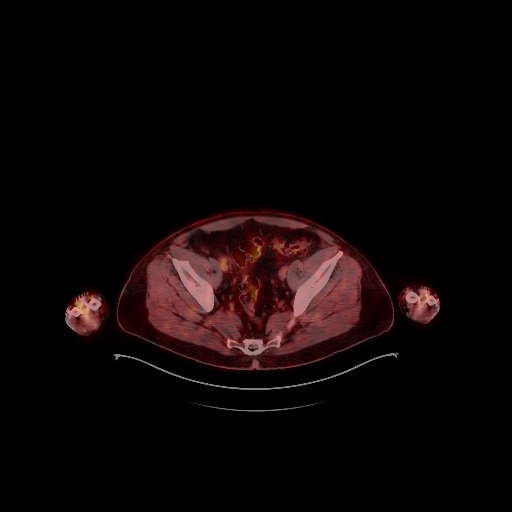
[im 121/242]
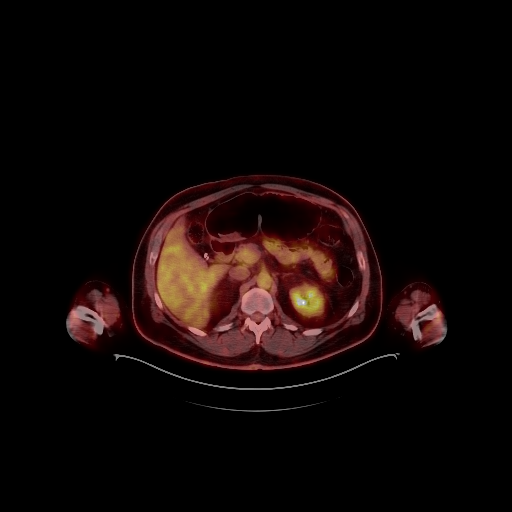
[im 181/242]
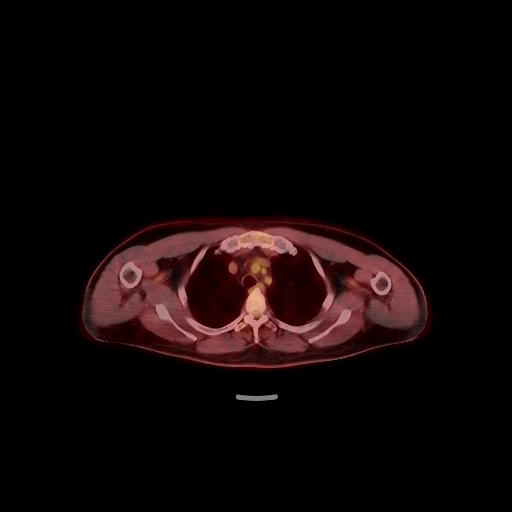
[im 242/242]
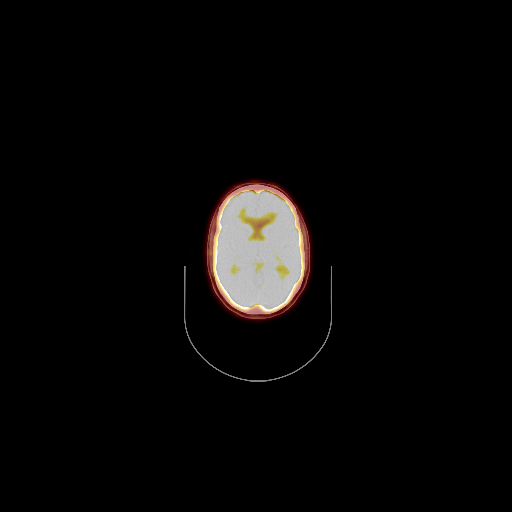

[25 of 25 positions shown; findings below may reference images not displayed]

FINDINGS: Mediastinal blood pool activity: SUV max

Liver activity: SUV max NA

NECK: FDG accumulation along the superior surface of the left
sternocleidomastoid muscle is likely related to the recent tertiary.
There is a tiny gas locule just posterior to the left sub mandibular
gland, also likely related to surgery although residual soft tissue
gas is only expected out to about 14 days, so this is more
persistent than typically seen. There is some low level FDG
accumulation in this region which is location of the excised mass
seen previously.

Incidental CT findings: none

CHEST: No hypermetabolic mediastinal or hilar nodes. No suspicious
pulmonary nodules on the CT scan. Small focus of uptake in the
distal esophagus may be physiologic or related to esophagitis.

Incidental CT findings: Atherosclerotic calcification is noted in
the wall of the thoracic aorta. Centrilobular emphsyema noted. 6 mm
right lung nodule identified on image 87/series 4. No overtly
suspicious pulmonary nodule or mass. No focal airspace
consolidation. There is no evidence of pleural effusion.

ABDOMEN/PELVIS: No abnormal hypermetabolic activity within the
liver, pancreas, adrenal glands, or spleen. No hypermetabolic lymph
nodes in the abdomen or pelvis.

Incidental CT findings: Diffuse low attenuation of liver parenchyma
is compatible with steatosis. Gallbladder is surgically absent. Tiny
nonobstructing stone noted upper pole left kidney. Left colonic
diverticulosis without diverticulitis. Prostate gland is enlarged.

SKELETON: No focal hypermetabolic activity to suggest skeletal
metastasis. No worrisome lytic or sclerotic osseous abnormality.

Incidental CT findings: none
IMPRESSION: 1. Low level uptake identified in the left neck surgical bed, likely
related to healing/granulation. There is a tiny gas bubble in the
surgical bed, likely related to the prior surgery although gas is
unexpected after about postoperative day 14. As infection could
present with soft tissue gas, close follow-up suggested.
2. No evidence for hypermetabolic metastatic disease in the neck,
chest, abdomen, or pelvis.
3. 6 mm right pulmonary nodule.  Attention on follow-up recommended.
4. Hepatic steatosis.
5.  Emphysema. (4X0T8-BUA.4)
6.  Aortic Atherosclerois (4X0T8-170.0)

## 2022-07-22 DIAGNOSIS — C024 Malignant neoplasm of lingual tonsil: Secondary | ICD-10-CM | POA: Diagnosis not present
